# Patient Record
Sex: Female | Born: 1990 | Hispanic: No | Marital: Single | Smoking: Current every day smoker
Health system: Southern US, Community
[De-identification: ages and names within clinical notes are randomized; demographics above are authoritative.]

## PROBLEM LIST (undated history)

## (undated) ENCOUNTER — Ambulatory Visit: Payer: Medicaid Other | Source: Home / Self Care

## (undated) ENCOUNTER — Inpatient Hospital Stay (HOSPITAL_COMMUNITY): Payer: Self-pay

## (undated) DIAGNOSIS — R51 Headache: Secondary | ICD-10-CM

## (undated) DIAGNOSIS — Z789 Other specified health status: Secondary | ICD-10-CM

## (undated) HISTORY — PX: NO PAST SURGERIES: SHX2092

## (undated) HISTORY — PX: MASS EXCISION: SHX2000

## (undated) HISTORY — PX: OTHER SURGICAL HISTORY: SHX169

---
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
## 2011-01-03 LAB — URINALYSIS, ROUTINE W REFLEX MICROSCOPIC
Glucose, UA: 500 — AB
Ketones, ur: NEGATIVE
Protein, ur: NEGATIVE

## 2011-01-03 LAB — URINE MICROSCOPIC-ADD ON

## 2011-01-03 LAB — GLUCOSE, CAPILLARY: Glucose-Capillary: 141 — ABNORMAL HIGH

## 2011-01-03 LAB — POCT PREGNANCY, URINE: Preg Test, Ur: NEGATIVE

## 2011-01-13 LAB — URINALYSIS, ROUTINE W REFLEX MICROSCOPIC
Glucose, UA: NEGATIVE
Ketones, ur: 15 — AB
Protein, ur: NEGATIVE
Urobilinogen, UA: 1

## 2011-01-13 LAB — BASIC METABOLIC PANEL
BUN: 12
Calcium: 9.5
Chloride: 105
Creatinine, Ser: 0.74

## 2011-01-13 LAB — URINE MICROSCOPIC-ADD ON

## 2011-04-04 NOTE — L&D Delivery Note (Cosign Needed)
Delivery Note At 9:45 PM a viable female was delivered via Vaginal, Spontaneous Delivery (Presentation: ROA ).  APGAR: , ; weight .   Placenta status: delivered via schultz, intact  Cord:  with the following complications: loose nuchal and body cord- delivered through  Anesthesia: Epidural  Episiotomy: n/a Lacerations: 1st degree hemostatic Rt periurethral and perineal- not repaired Suture Repair: n/a Est. Blood Loss (mL):   Mom to postpartum.  Baby to nursery-stable.  Plans to breastfeed, desires Mircronor.  Wants circumcision, undecided about in hospital vs. out at this time  Philipp Deputy, CNM in attendance of birth  Joellyn Haff, [place] 05/02/2011, 10:02 PM

## 2011-04-24 ENCOUNTER — Observation Stay (HOSPITAL_COMMUNITY)
Admission: AD | Admit: 2011-04-24 | Discharge: 2011-04-25 | DRG: 780 | Disposition: A | Discharge status: Home / Self Care | Payer: Medicaid Other | Source: Ambulatory Visit | Attending: Obstetrics & Gynecology | Admitting: Obstetrics & Gynecology

## 2011-04-24 ENCOUNTER — Encounter (HOSPITAL_COMMUNITY): Payer: Self-pay

## 2011-04-24 DIAGNOSIS — O4290 Premature rupture of membranes, unspecified as to length of time between rupture and onset of labor, unspecified weeks of gestation: Secondary | ICD-10-CM | POA: Diagnosis present

## 2011-04-24 DIAGNOSIS — O479 False labor, unspecified: Principal | ICD-10-CM | POA: Diagnosis present

## 2011-04-24 HISTORY — DX: Headache: R51

## 2011-04-24 LAB — CBC
HCT: 39.7 % (ref 36.0–46.0)
Hemoglobin: 13.1 g/dL (ref 12.0–15.0)
MCHC: 33 g/dL (ref 30.0–36.0)
MCV: 91.9 fL (ref 78.0–100.0)

## 2011-04-24 LAB — POCT FERN TEST: Fern Test: POSITIVE

## 2011-04-24 LAB — ABO/RH: RH Type: POSITIVE

## 2011-04-24 LAB — ANTIBODY SCREEN: Antibody Screen: NEGATIVE

## 2011-04-24 LAB — HIV ANTIBODY (ROUTINE TESTING W REFLEX): HIV: NONREACTIVE

## 2011-04-24 MED ORDER — ONDANSETRON HCL 4 MG/2ML IJ SOLN: 4.0000 mg | Freq: Four times a day (QID) | INTRAMUSCULAR | Status: DC | PRN

## 2011-04-24 MED ORDER — LIDOCAINE HCL (PF) 1 % IJ SOLN: 30.0000 mL | INTRAMUSCULAR | Status: DC | PRN

## 2011-04-24 MED ORDER — OXYTOCIN BOLUS FROM INFUSION
500.0000 mL | Freq: Once | INTRAVENOUS | Status: DC
  Filled 2011-04-24: qty 500

## 2011-04-24 MED ORDER — CITRIC ACID-SODIUM CITRATE 334-500 MG/5ML PO SOLN: 30.0000 mL | ORAL | Status: DC | PRN

## 2011-04-24 MED ORDER — LACTATED RINGERS IV SOLN
INTRAVENOUS | Status: DC
  Administered 2011-04-24: 20:00:00 via INTRAVENOUS

## 2011-04-24 MED ORDER — ACETAMINOPHEN 325 MG PO TABS
650.0000 mg | ORAL_TABLET | ORAL | Status: DC | PRN
  Administered 2011-04-24: 650 mg via ORAL
  Filled 2011-04-24: qty 2

## 2011-04-24 MED ORDER — LACTATED RINGERS IV SOLN
500.0000 mL | INTRAVENOUS | Status: DC | PRN
Start: 2011-04-24 — End: 2011-04-25

## 2011-04-24 MED ORDER — FLEET ENEMA 7-19 GM/118ML RE ENEM: 1.0000 | ENEMA | RECTAL | Status: DC | PRN

## 2011-04-24 MED ORDER — OXYCODONE-ACETAMINOPHEN 5-325 MG PO TABS: 2.0000 | ORAL_TABLET | ORAL | Status: DC | PRN

## 2011-04-24 MED ORDER — OXYTOCIN 20 UNITS IN LACTATED RINGERS INFUSION - SIMPLE: 125.0000 mL/h | Freq: Once | INTRAVENOUS | Status: DC

## 2011-04-24 MED ORDER — IBUPROFEN 600 MG PO TABS: 600.0000 mg | ORAL_TABLET | Freq: Four times a day (QID) | ORAL | Status: DC | PRN

## 2011-04-24 NOTE — H&P (Signed)
Alexa [name] is a 21 y.o. female presenting for SROM  Patient states fluid leakage around 1400 today. Contractions mild and irregular. No complaints at this time  [facility] Maternal labs normal. GBS negative. Pregnancy uncomplicated.. . Maternal Medical History:  Reason for admission: Reason for admission: rupture of membranes.  Reason for Admission:   nauseaContractions: Onset was 3-5 hours ago.   Frequency: irregular.   Perceived severity is mild.    Fetal activity: Perceived fetal activity is normal.   Last perceived fetal movement was within the past hour.    Prenatal Complications - Diabetes: none.    OB History    Grav Para Term Preterm Abortions TAB SAB Ect Mult Living   1              Past Medical History  Diagnosis Date  . Headache    Past Surgical History  Procedure Date  . No past surgeries    Family History: family history is not on file. Social History:  reports that she has never smoked. She does not have any smokeless tobacco history on file. She reports that she does not drink alcohol or use illicit drugs.  Review of Systems  Constitutional: Negative for fever and chills.  Eyes: Negative for blurred vision and double vision.  Respiratory: Negative for sputum production.   Cardiovascular: Negative for chest pain and palpitations.  Gastrointestinal: Negative for nausea, vomiting, abdominal pain, diarrhea and constipation.  Genitourinary: Negative for dysuria, urgency and frequency.  Neurological: Negative for headaches.  All other systems reviewed and are negative.    Dilation: 3 Effacement (%): 60 Station: -3 Exam by:: Dr. Garen Lah Blood pressure 132/77, pulse 87, temperature 98.4 F (36.9 C), temperature source Oral, resp. rate 16, height 5\' 2"  (1.575 m), weight 158 lb 6.4 oz (71.85 kg), SpO2 99.00%. Maternal Exam:  Uterine Assessment: Contraction strength is mild.  Abdomen: Patient reports  no abdominal tenderness. Introitus: Normal vulva. Normal vagina.  Ferning test: positive.  Amniotic fluid character: clear.  Pelvis: adequate for delivery.   Cervix: Cervix evaluated by sterile speculum exam and digital exam.     Physical Exam  Constitutional: She appears well-developed and well-nourished. No distress.  Neurological: She is alert. She has normal reflexes.  Skin: Skin is warm and dry. No rash noted. She is not diaphoretic. No erythema. No pallor.  Psychiatric: She has a normal mood and affect. Her behavior is normal. Judgment and thought content normal.    Prenatal labs: ABO, Rh:   Antibody:   Rubella:   RPR: NON REACTIVE (06/09 1520)  HBsAg:    HIV:    GBS:     Assessment/Plan: SROM  - admit to L&D  - SROM at 1400 clear fluid  - pregnancy uncomplicated  - GBS neg  - epidural per patient  - routine care, expectant management  - anticipate SVD   Cameron Proud 04/24/2011, 7:20 PM

## 2011-04-24 NOTE — Progress Notes (Signed)
Pt may go to room 164. 

## 2011-04-24 NOTE — Progress Notes (Signed)
Provider made aware of pt status: FHT tracing, uterine contraction pattern, SVE. Pt denies any more leaking of fluid and denies pain. Will be at bedside to evaluate.

## 2011-04-24 NOTE — Progress Notes (Signed)
Khai Torbert is a 21 y.o. G1P0 at [redacted]w[redacted]d   Subjective: Has had migraine. Tylenol has helped.  Objective: BP 126/80  Pulse 82  Temp(Src) 98.5 F (36.9 C) (Oral)  Resp 20  Ht 5\' 2"  (1.575 m)  Wt 71.668 kg (158 lb)  BMI 28.90 kg/m2  SpO2 99%      FHT:  FHR: 1146 bpm, variability: moderate,  accelerations:  Present,  decelerations:  Absent UC:   regular, every 6 minutes SVE:   Dilation: 1 Effacement (%): 50 Station: -3 Exam by:: Piloto de la Paz MD  Labs: Lab Results  Component Value Date   WBC 11.1* 04/24/2011   HGB 13.1 04/24/2011   HCT 39.7 04/24/2011   MCV 91.9 04/24/2011   PLT 157 04/24/2011    Assessment / Plan: PROM   Labor: Progressing normally Fetal Wellbeing:  Category I Pain Control:  Labor support without medications I/D:  n/a Anticipated MOD:  NSVD We checked with amnisure since pt has not have any more vaginal fluid loss and no cervical changes that favor active labor. Induction of labor will be considered if test is positive.  D. Piloto Sherron Flemings Paz. MD PGY-1  04/24/2011, 11:37 PM

## 2011-04-24 NOTE — Progress Notes (Signed)
Patient states that between 1300 and 1400 she sat up and had leaking clear fluid with no odor. Has not had any since. Has been having a persistant headache for about one week and does have some mild cramping. Reports no bleeding and has good fetal movement.

## 2011-04-25 NOTE — Discharge Summary (Signed)
Obstetric Discharge Summary Reason for Admission: rupture of membranes Intrapartum Procedures: n/a Postpartum Procedures: none Complications-Operative and Postpartum: none Hemoglobin  Date Value Range Status  04/24/2011 13.1  12.0-15.0 (g/dL) Final     HCT  Date Value Range Status  04/24/2011 39.7  36.0-46.0 (%) Final    Discharge Diagnoses: false labor. Pt admitted for possible SROM with ferning. Amnisure negative. No cervical changes and FHT reassuring. Discharge Information: Date: 04/25/2011 Activity: unrestricted Diet: routine Medications: tylenol Condition: improved Discharge to: home Follow-up Information    Call [facility] [place]. (As needed, if symptoms worsen, at scheduled appointment)    Contact information:   [address] [facility] [place] [address]          Newborn Data: This patient has no babies on file.  D. Piloto Sherron Flemings Paz. MD PGY-1  04/25/2011, 12:17 AM

## 2011-04-26 NOTE — H&P (Signed)
Agree with note. 

## 2011-05-02 ENCOUNTER — Inpatient Hospital Stay (HOSPITAL_COMMUNITY)
Admission: AD | Admit: 2011-05-02 | Discharge: 2011-05-04 | DRG: 775 | Disposition: A | Discharge status: Home / Self Care | Payer: Medicaid Other | Source: Ambulatory Visit | Attending: Obstetrics and Gynecology | Admitting: Obstetrics and Gynecology

## 2011-05-02 ENCOUNTER — Inpatient Hospital Stay (HOSPITAL_COMMUNITY): Payer: Medicaid Other | Admitting: Anesthesiology

## 2011-05-02 ENCOUNTER — Encounter (HOSPITAL_COMMUNITY): Payer: Self-pay | Admitting: Anesthesiology

## 2011-05-02 ENCOUNTER — Encounter (HOSPITAL_COMMUNITY): Payer: Self-pay

## 2011-05-02 LAB — CBC
MCH: 29.8 pg (ref 26.0–34.0)
Platelets: 142 10*3/uL — ABNORMAL LOW (ref 150–400)
RBC: 4.6 MIL/uL (ref 3.87–5.11)
WBC: 13.3 10*3/uL — ABNORMAL HIGH (ref 4.0–10.5)

## 2011-05-02 LAB — RPR: RPR Ser Ql: NONREACTIVE

## 2011-05-02 MED ORDER — OXYTOCIN BOLUS FROM INFUSION
500.0000 mL | Freq: Once | INTRAVENOUS | Status: DC
  Filled 2011-05-02: qty 500

## 2011-05-02 MED ORDER — ONDANSETRON HCL 4 MG/2ML IJ SOLN: 4.0000 mg | Freq: Four times a day (QID) | INTRAMUSCULAR | Status: DC | PRN

## 2011-05-02 MED ORDER — LACTATED RINGERS IV SOLN: 500.0000 mL | INTRAVENOUS | Status: DC | PRN

## 2011-05-02 MED ORDER — LANOLIN HYDROUS EX OINT: TOPICAL_OINTMENT | CUTANEOUS | Status: DC | PRN

## 2011-05-02 MED ORDER — PRENATAL MULTIVITAMIN CH
1.0000 | ORAL_TABLET | Freq: Every day | ORAL | Status: DC
  Administered 2011-05-03: 1 via ORAL
  Filled 2011-05-02: qty 1

## 2011-05-02 MED ORDER — OXYCODONE-ACETAMINOPHEN 5-325 MG PO TABS
1.0000 | ORAL_TABLET | ORAL | Status: DC | PRN
  Administered 2011-05-03 – 2011-05-04 (×2): 1 via ORAL
  Filled 2011-05-02 (×2): qty 1

## 2011-05-02 MED ORDER — ONDANSETRON HCL 4 MG PO TABS: 4.0000 mg | ORAL_TABLET | ORAL | Status: DC | PRN

## 2011-05-02 MED ORDER — OXYTOCIN 20 UNITS IN LACTATED RINGERS INFUSION - SIMPLE
1.0000 m[IU]/min | INTRAVENOUS | Status: DC
  Administered 2011-05-02: 1 m[IU]/min via INTRAVENOUS
  Filled 2011-05-02: qty 1000

## 2011-05-02 MED ORDER — DIPHENHYDRAMINE HCL 25 MG PO CAPS: 25.0000 mg | ORAL_CAPSULE | Freq: Four times a day (QID) | ORAL | Status: DC | PRN

## 2011-05-02 MED ORDER — ACETAMINOPHEN 325 MG PO TABS: 650.0000 mg | ORAL_TABLET | ORAL | Status: DC | PRN

## 2011-05-02 MED ORDER — PHENYLEPHRINE 40 MCG/ML (10ML) SYRINGE FOR IV PUSH (FOR BLOOD PRESSURE SUPPORT): 80.0000 ug | PREFILLED_SYRINGE | INTRAVENOUS | Status: DC | PRN

## 2011-05-02 MED ORDER — DIBUCAINE 1 % RE OINT
1.0000 "application " | TOPICAL_OINTMENT | RECTAL | Status: DC | PRN
  Filled 2011-05-02: qty 28

## 2011-05-02 MED ORDER — EPHEDRINE 5 MG/ML INJ
10.0000 mg | INTRAVENOUS | Status: DC | PRN
  Filled 2011-05-02: qty 4

## 2011-05-02 MED ORDER — EPHEDRINE 5 MG/ML INJ: 10.0000 mg | INTRAVENOUS | Status: DC | PRN

## 2011-05-02 MED ORDER — IBUPROFEN 600 MG PO TABS: 600.0000 mg | ORAL_TABLET | Freq: Four times a day (QID) | ORAL | Status: DC | PRN

## 2011-05-02 MED ORDER — SIMETHICONE 80 MG PO CHEW: 80.0000 mg | CHEWABLE_TABLET | ORAL | Status: DC | PRN

## 2011-05-02 MED ORDER — FENTANYL 2.5 MCG/ML BUPIVACAINE 1/10 % EPIDURAL INFUSION (WH - ANES)
14.0000 mL/h | INTRAMUSCULAR | Status: DC
  Administered 2011-05-02: 14 mL/h via EPIDURAL
  Administered 2011-05-02: 12 mL/h via EPIDURAL
  Administered 2011-05-02: 14 mL/h via EPIDURAL
  Filled 2011-05-02 (×3): qty 60

## 2011-05-02 MED ORDER — ONDANSETRON HCL 4 MG/2ML IJ SOLN: 4.0000 mg | INTRAMUSCULAR | Status: DC | PRN

## 2011-05-02 MED ORDER — TETANUS-DIPHTH-ACELL PERTUSSIS 5-2.5-18.5 LF-MCG/0.5 IM SUSP
0.5000 mL | Freq: Once | INTRAMUSCULAR | Status: AC
Start: 2011-05-03 — End: 2011-05-03
  Administered 2011-05-03: 0.5 mL via INTRAMUSCULAR
  Filled 2011-05-02: qty 0.5

## 2011-05-02 MED ORDER — SENNOSIDES-DOCUSATE SODIUM 8.6-50 MG PO TABS
2.0000 | ORAL_TABLET | Freq: Every day | ORAL | Status: DC
  Administered 2011-05-03: 2 via ORAL

## 2011-05-02 MED ORDER — ZOLPIDEM TARTRATE 5 MG PO TABS: 5.0000 mg | ORAL_TABLET | Freq: Every evening | ORAL | Status: DC | PRN

## 2011-05-02 MED ORDER — DIPHENHYDRAMINE HCL 50 MG/ML IJ SOLN: 12.5000 mg | INTRAMUSCULAR | Status: DC | PRN

## 2011-05-02 MED ORDER — CITRIC ACID-SODIUM CITRATE 334-500 MG/5ML PO SOLN: 30.0000 mL | ORAL | Status: DC | PRN

## 2011-05-02 MED ORDER — IBUPROFEN 600 MG PO TABS
600.0000 mg | ORAL_TABLET | Freq: Four times a day (QID) | ORAL | Status: DC
  Administered 2011-05-03 – 2011-05-04 (×7): 600 mg via ORAL
  Filled 2011-05-02 (×7): qty 1

## 2011-05-02 MED ORDER — OXYTOCIN 20 UNITS IN LACTATED RINGERS INFUSION - SIMPLE
125.0000 mL/h | Freq: Once | INTRAVENOUS | Status: AC
  Administered 2011-05-02: 500 mL/h via INTRAVENOUS

## 2011-05-02 MED ORDER — LACTATED RINGERS IV SOLN
500.0000 mL | Freq: Once | INTRAVENOUS | Status: AC
  Administered 2011-05-02: 500 mL via INTRAVENOUS

## 2011-05-02 MED ORDER — PHENYLEPHRINE 40 MCG/ML (10ML) SYRINGE FOR IV PUSH (FOR BLOOD PRESSURE SUPPORT)
80.0000 ug | PREFILLED_SYRINGE | INTRAVENOUS | Status: DC | PRN
  Filled 2011-05-02: qty 5

## 2011-05-02 MED ORDER — WITCH HAZEL-GLYCERIN EX PADS: 1.0000 "application " | MEDICATED_PAD | CUTANEOUS | Status: DC | PRN

## 2011-05-02 MED ORDER — FLEET ENEMA 7-19 GM/118ML RE ENEM: 1.0000 | ENEMA | RECTAL | Status: DC | PRN

## 2011-05-02 MED ORDER — LIDOCAINE HCL (PF) 1 % IJ SOLN
30.0000 mL | INTRAMUSCULAR | Status: DC | PRN
  Filled 2011-05-02: qty 30

## 2011-05-02 MED ORDER — TERBUTALINE SULFATE 1 MG/ML IJ SOLN: 0.2500 mg | Freq: Once | INTRAMUSCULAR | Status: DC | PRN

## 2011-05-02 MED ORDER — LACTATED RINGERS IV SOLN
INTRAVENOUS | Status: DC
  Administered 2011-05-02: 125 mL/h via INTRAVENOUS
  Administered 2011-05-02: 20:00:00 via INTRAVENOUS

## 2011-05-02 MED ORDER — OXYCODONE-ACETAMINOPHEN 5-325 MG PO TABS: 1.0000 | ORAL_TABLET | ORAL | Status: DC | PRN

## 2011-05-02 MED ORDER — LIDOCAINE HCL 1.5 % IJ SOLN
INTRAMUSCULAR | Status: DC | PRN
  Administered 2011-05-02 (×2): 5 mL via EPIDURAL

## 2011-05-02 MED ORDER — BENZOCAINE-MENTHOL 20-0.5 % EX AERO
1.0000 "application " | INHALATION_SPRAY | CUTANEOUS | Status: DC | PRN
  Filled 2011-05-02: qty 56

## 2011-05-02 NOTE — Progress Notes (Signed)
Alexa [name] is a 21 y.o. G1P0000 at [redacted]w[redacted]d   Subjective: Feeling pressure but comfortable with epidural Objective: BP 122/83  Pulse 92  Temp(Src) 98.4 F (36.9 C) (Oral)  Resp 20  Ht 5\' 2"  (1.575 m)  Wt 71.668 kg (158 lb)  BMI 28.90 kg/m2  SpO2 100%  FHT:  FHR: 146 bpm, variability: moderate,  accelerations:  Present,  decelerations:  Absent UC:   regular, every 2-3 minutes SVE:   Dilation: 10 Effacement (%): 100 Station: +2 Exam by:: Piloto, MD  Labs: Lab Results  Component Value Date   WBC 13.3* 05/02/2011   HGB 13.7 05/02/2011   HCT 41.9 05/02/2011   MCV 91.1 05/02/2011   PLT 142* 05/02/2011    Assessment / Plan: Augmentation of labor, progressing well  Labor: Progressing on Pitocin, will continue to increase then AROM Fetal Wellbeing:  Category I Pain Control:  Epidural I/D:  n/a Anticipated MOD:  NSVD   D. Piloto [employer]. MD PGY-1 05/02/2011, 8:02 PM

## 2011-05-02 NOTE — Progress Notes (Signed)
Pt states labor began at 0730, denies bleeding or lof, +fm. Was 1cm 1 wk ago. Ctx's less than apart.

## 2011-05-02 NOTE — Progress Notes (Signed)
Dr. Garen Lah notified of SVE, SROM clear fluid, FHR, UC pattern, and pitocin amount.  Will continue to monitor.

## 2011-05-02 NOTE — Progress Notes (Signed)
Alexa [name] is a 21 y.o. G1P0000 at [redacted]w[redacted]d   Subjective: Feeling comfortable with epidural. SROM at 4:00, clear.  Objective: BP 121/75  Pulse 77  Temp(Src) 98.1 F (36.7 C) (Oral)  Resp 18  Ht 5\' 2"  (1.575 m)  Wt 71.668 kg (158 lb)  BMI 28.90 kg/m2  SpO2 100%    FHT:  FHR: 146 bpm, variability: moderate,  accelerations:  Present,  decelerations:  Absent UC:   regular, every 2-3 minutes SVE:   Dilation: 6 Effacement (%): 90 Station: -1 Exam by:: Valentina Lucks, RN  Labs: Lab Results  Component Value Date   WBC 13.3* 05/02/2011   HGB 13.7 05/02/2011   HCT 41.9 05/02/2011   MCV 91.1 05/02/2011   PLT 142* 05/02/2011    Assessment / Plan: Augmentation of labor, progressing well  Labor: Progressing on Pitocin, will continue to increase then AROM Fetal Wellbeing:  Category I Pain Control:  Epidural I/D:  n/a Anticipated MOD:  NSVD   D. Piloto [employer]. MD PGY-1 05/02/2011, 5:36 PM

## 2011-05-02 NOTE — H&P (Signed)
Alexa [name] is a 21 y.o. female presenting for contractions  37yr G1P0 at 39.4wks presents with contractions  Patient began feeling contractions approx 3hrs ago, frequent, severe. No leakage of fluid. Reports good fetal movement. Denies Headache, vision changes, GI or GU complaints  Pt received prenatal care at health dept. Pregnancy uncomplicated. GBS negative. . Maternal Medical History:  Reason for admission: Reason for admission: contractions.  Reason for Admission:   nauseaContractions: Onset was 3-5 hours ago.   Frequency: regular.   Perceived severity is strong.    Fetal activity: Perceived fetal activity is normal.   Last perceived fetal movement was within the past hour.      OB History    Grav Para Term Preterm Abortions TAB SAB Ect Mult Living   1              Past Medical History  Diagnosis Date  . Headache    Past Surgical History  Procedure Date  . No past surgeries    Family History: family history is negative for Anesthesia problems. Social History:  reports that she has never smoked. She has never used smokeless tobacco. She reports that she does not drink alcohol or use illicit drugs.  Review of Systems  Constitutional: Negative for fever.  Eyes: Negative for blurred vision and double vision.  Respiratory: Negative for shortness of breath.   Cardiovascular: Negative for chest pain.  Gastrointestinal: Negative for heartburn, nausea, vomiting and abdominal pain.  Genitourinary: Negative for dysuria and urgency.  Skin: Negative for rash.  Neurological: Negative for headaches.  All other systems reviewed and are negative.    Dilation: 3 Effacement (%): 70 Station: -2;-3 Exam by:: Lucy Chris RNC Blood pressure 132/76, pulse 89. Maternal Exam:  Uterine Assessment: Contraction strength is moderate.  Contraction frequency is regular.      Fetal Exam Fetal Monitor Review: Baseline rate: 150.  Variability: moderate (6-25 bpm).     Pattern: accelerations present and no decelerations.    Fetal State Assessment: Category I - tracings are normal.     Physical Exam  Vitals reviewed. Constitutional: She is oriented to person, place, and time. She appears well-developed and well-nourished. No distress.  HENT:  Head: Normocephalic and atraumatic.  Eyes: EOM are normal. Pupils are equal, round, and reactive to light.  Cardiovascular: Normal rate, regular rhythm, normal heart sounds and intact distal pulses.  Exam reveals no gallop and no friction rub.   No murmur heard. Respiratory: Effort normal and breath sounds normal. No respiratory distress. She has no wheezes. She has no rales.  GI: Soft. She exhibits no distension and no mass. There is no tenderness. There is no rebound and no guarding.  Musculoskeletal: Normal range of motion. She exhibits no edema and no tenderness.  Neurological: She is alert and oriented to person, place, and time. She has normal reflexes.  Skin: Skin is warm and dry. No rash noted. She is not diaphoretic. No erythema. No pallor.  Psychiatric: She has a normal mood and affect. Her behavior is normal. Judgment and thought content normal.    Prenatal labs: ABO, Rh: AB/Positive/-- (01/21 2029) Antibody: Negative (01/21 2029) Rubella: Immune (01/21 2029) RPR: NON REACTIVE (01/21 1930)  HBsAg: Negative (01/21 2029)  HIV: Non-reactive (01/21 2029)  GBS: Negative (01/21 2029)   Assessment/Plan: Active Labor  - admit to L&D  - unruptured  - patient requests epidural  - category I tracing  - GBS negative  - expectant management  - anticipate SVD  Cameron Proud 05/02/2011, 9:56 AM

## 2011-05-02 NOTE — H&P (Signed)
Agree with above note.  Nyrie Sigal 05/02/2011 4:56 PM   

## 2011-05-02 NOTE — Progress Notes (Signed)
Alexa [name] is a 21 y.o. G1P0000 at [redacted]w[redacted]d by ultrasound admitted for active labor  Subjective: Patient doing well, comfotbale with epidural, not feeling contractions as much  Objective: BP 121/83  Pulse 81  Temp(Src) 97.6 F (36.4 C) (Oral)  Resp 18  Ht 5\' 2"  (1.575 m)  Wt 71.668 kg (158 lb)  BMI 28.90 kg/m2  SpO2 100%      FHT:  FHR: 150 bpm, variability: moderate,  accelerations:  Abscent,  decelerations:  Absent UC:   regular, every 3-5 minutes SVE:   Dilation: 4 Effacement (%): 70 Station: -1 Exam by:: Valentina Lucks, RN  Labs: Lab Results  Component Value Date   WBC 13.3* 05/02/2011   HGB 13.7 05/02/2011   HCT 41.9 05/02/2011   MCV 91.1 05/02/2011   PLT 142* 05/02/2011    Assessment / Plan: Spontaneous labor, progressing normally  Labor: progressing as expected, will start low dose pitocin and continue to monitor.  Preeclampsia:  no S/Sx Fetal Wellbeing:  Category I Pain Control:  Epidural I/D:  n/a Anticipated MOD:  NSVD  Cameron Proud 05/02/2011, 2:17 PM

## 2011-05-02 NOTE — Progress Notes (Signed)
Dr. Garen Lah at bedside and notified of pt status, SVE, FHR, UC pattern, and epidural placement.  Will continue to monitor.

## 2011-05-02 NOTE — Anesthesia Preprocedure Evaluation (Signed)

## 2011-05-02 NOTE — Progress Notes (Signed)
Pt states u/c's less than 5 minutes, denies bleeding or lof, +FM. Was 1cm in officde 1 week ago.

## 2011-05-02 NOTE — Anesthesia Procedure Notes (Signed)
Epidural Patient location during procedure: OB Start time: 05/02/2011 11:12 AM  Staffing Anesthesiologist: Brayton Caves R Performed by: anesthesiologist   Preanesthetic Checklist Completed: patient identified, site marked, surgical consent, pre-op evaluation, timeout performed, IV checked, risks and benefits discussed and monitors and equipment checked  Epidural Patient position: sitting Prep: site prepped and draped and DuraPrep Patient monitoring: continuous pulse ox and blood pressure Approach: midline Injection technique: LOR air and LOR saline  Needle:  Needle type: Tuohy  Needle gauge: 17 G Needle length: 9 cm Needle insertion depth: 5 cm cm Catheter type: closed end flexible Catheter size: 19 Gauge Catheter at skin depth: 10 cm Test dose: negative  Assessment Events: blood not aspirated, injection not painful, no injection resistance, negative IV test and no paresthesia  Additional Notes Patient identified.  Risk benefits discussed including failed block, incomplete pain control, headache, nerve damage, paralysis, blood pressure changes, nausea, vomiting, reactions to medication both toxic or allergic, and postpartum back pain.  Patient expressed understanding and wished to proceed.  All questions were answered.  Sterile technique used throughout procedure and epidural site dressed with sterile barrier dressing. No paresthesia or other complications noted.The patient did not experience any signs of intravascular injection such as tinnitus or metallic taste in mouth nor signs of intrathecal spread such as rapid motor block. Please see nursing notes for vital signs.

## 2011-05-02 NOTE — Progress Notes (Signed)
Kasi Lasky is a 21 y.o. G1P0000 at [redacted]w[redacted]d by ultrasound admitted for active labor  Subjective: Patient doing well, comfortable with epidural.   Objective: BP 117/79  Pulse 82  Temp(Src) 97.6 F (36.4 C) (Oral)  Resp 20  Ht 5\' 2"  (1.575 m)  Wt 71.668 kg (158 lb)  BMI 28.90 kg/m2  SpO2 100%      FHT:  FHR: 150 bpm, variability: moderate,  accelerations:  Present,  decelerations:  Absent UC:   regular, every 3-5 minutes SVE:   Dilation: 3 Effacement (%): 70 Station: -2;-3 Exam by:: Lucy Chris RNC  Labs: Lab Results  Component Value Date   WBC 13.3* 05/02/2011   HGB 13.7 05/02/2011   HCT 41.9 05/02/2011   MCV 91.1 05/02/2011   PLT 142* 05/02/2011    Assessment / Plan: Spontaneous labor, progressing normally  Labor: Progressing normally Preeclampsia:  no SSx Fetal Wellbeing:  Category I Pain Control:  Epidural I/D:  n/a Anticipated MOD:  NSVD  Will continue to monitor, consider adding Pitocin if not progressing appropriately, consider AROM once further dilated.    Cameron Proud 05/02/2011, 11:47 AM

## 2011-05-02 NOTE — Progress Notes (Signed)
  Subjective:  Patient comfortable, no complaints Objective: BP 121/75  Pulse 77  Temp(Src) 98.1 F (36.7 C) (Oral)  Resp 18  Ht 5\' 2"  (1.575 m)  Wt 71.668 kg (158 lb)  BMI 28.90 kg/m2  SpO2 100%   Total I/O In: -  Out: 100 [Urine:100]  FHT:  FHR: 150 bpm, variability: moderate,  accelerations:  Present,  decelerations:  Absent UC:   regular, every 2-3 minutes SVE:   Dilation: 6 Effacement (%): 90 Station: -1 Exam by:: Valentina Lucks, RN  Labs: Lab Results  Component Value Date   WBC 13.3* 05/02/2011   HGB 13.7 05/02/2011   HCT 41.9 05/02/2011   MCV 91.1 05/02/2011   PLT 142* 05/02/2011    Assessment / Plan: Spontaneous labor, progressing normally  Labor: Progressing normally ; patient SROM in the interim, good cervical change, will continue to monitor, Preeclampsia:  no SSx Fetal Wellbeing:  Category I Pain Control:  Epidural I/D:  n/a Anticipated MOD:  NSVD  Cameron Proud 05/02/2011, 5:42 PM

## 2011-05-03 MED ORDER — GLYCERIN (LAXATIVE) 2.1 G RE SUPP
1.0000 | Freq: Once | RECTAL | Status: AC
  Administered 2011-05-03: 1 via RECTAL
  Filled 2011-05-03: qty 1

## 2011-05-03 NOTE — Anesthesia Postprocedure Evaluation (Signed)
  Anesthesia Post-op Note  Patient: Alexa [name]  Procedure(s) Performed: * No procedures listed *  Patient Location: Mother/Baby  Anesthesia Type: Epidural  Level of Consciousness: alert  and oriented  Airway and Oxygen Therapy: Patient Spontanous Breathing  Post-op Pain: mild  Post-op Assessment: Patient's Cardiovascular Status Stable and Respiratory Function Stable  Post-op Vital Signs: stable  Complications: No apparent anesthesia complications

## 2011-05-03 NOTE — Progress Notes (Signed)
Post Partum Day 1 Subjective: no complaints, up ad lib, voiding and tolerating PO  Objective: Blood pressure 111/72, pulse 93, temperature 97.9 F (36.6 C), temperature source Oral, resp. rate 20, height 5\' 2"  (1.575 m), weight 71.668 kg (158 lb), SpO2 98.00%, unknown if currently breastfeeding.  Physical Exam:  General: alert, cooperative and no distress Lochia: appropriate Uterine Fundus: firm Incision: n/a DVT Evaluation: No evidence of DVT seen on physical exam. Negative Homan's sign. No cords or calf tenderness. No significant calf/ankle edema.   Basename 05/02/11 1030  HGB 13.7  HCT 41.9    Assessment/Plan: Plan for discharge tomorrow, Breastfeeding, Lactation consult and Contraception [place]; wants out of hospital circumcision   LOS: 1 day   Joellyn Haff, SNM 05/03/2011, 7:33 AM

## 2011-05-03 NOTE — Progress Notes (Signed)
UR chart review completed.  

## 2011-05-04 NOTE — Progress Notes (Signed)
Post Partum Day 2 Subjective: no complaints, up ad lib, voiding, tolerating PO, + flatus and (+)BM3 Patient doing well, states she would like to go home  Objective: Blood pressure 114/77, pulse 96, temperature 98.4 F (36.9 C), temperature source Oral, resp. rate 18, height 5\' 2"  (1.575 m), weight 71.668 kg (158 lb), SpO2 98.00%, unknown if currently breastfeeding.  Physical Exam:  General: alert, cooperative, appears stated age and no distress Lochia: appropriate Uterine Fundus: firm Incision: none DVT Evaluation: No evidence of DVT seen on physical exam. Negative Homan's sign. No cords or calf tenderness. No significant calf/ankle edema. Lungs CTA bilaterally, no wheeze/rhonchi/rales Heart RRR no murmur, gallop, rub   Basename 05/02/11 1030  HGB 13.7  HCT 41.9    Assessment/Plan: Discharge home, Breastfeeding and Contraception OCPs Follow up with [facility] 6wks Ibuprofen for pain   LOS: 2 days   Cameron Proud 05/04/2011, 7:28 AM

## 2011-05-04 NOTE — Discharge Summary (Signed)
Obstetric Discharge Summary Reason for Admission: onset of labor Prenatal Procedures: none Intrapartum Procedures: spontaneous vaginal delivery Postpartum Procedures: none Complications-Operative and Postpartum: 1st deg periurethral laceration Hemoglobin  Date Value Range Status  05/02/2011 13.7  12.0-15.0 (g/dL) Final     HCT  Date Value Range Status  05/02/2011 41.9  36.0-46.0 (%) Final    Discharge Diagnoses: Term Pregnancy-delivered  Discharge Information: Date: 05/04/2011 Activity: unrestricted and pelvic rest Diet: routine Medications: PNV and Ibuprofen Condition: stable Instructions: refer to practice specific booklet Discharge to: home Follow-up Information    Follow up with [facility] in 6 weeks.         Newborn Data: Live born female  Birth Weight: 6 lb 14.2 oz (3125 g) APGAR: 8, 9  Home with mother.  Cameron Proud 05/04/2011, 7:33 AM

## 2011-05-05 NOTE — Discharge Summary (Signed)
Agree with above note.  Alexa [name] 05/05/2011 5:52 AM   

## 2011-05-08 NOTE — Progress Notes (Signed)
I agree with the above. Cam Hai 9:42 PM 05/08/2011

## 2011-06-02 ENCOUNTER — Encounter (HOSPITAL_COMMUNITY): Payer: Self-pay

## 2011-06-02 ENCOUNTER — Emergency Department (HOSPITAL_COMMUNITY)
Admission: EM | Admit: 2011-06-02 | Discharge: 2011-06-02 | Disposition: A | Discharge status: Home / Self Care | Payer: Medicaid Other | Source: Home / Self Care | Attending: Emergency Medicine | Admitting: Emergency Medicine

## 2011-06-02 DIAGNOSIS — N9489 Other specified conditions associated with female genital organs and menstrual cycle: Secondary | ICD-10-CM

## 2011-06-02 DIAGNOSIS — N766 Ulceration of vulva: Secondary | ICD-10-CM

## 2011-06-02 LAB — WET PREP, GENITAL

## 2011-06-02 MED ORDER — METRONIDAZOLE 0.75 % VA GEL: 1.0000 | Freq: Two times a day (BID) | VAGINAL | Status: AC

## 2011-06-02 MED ORDER — TRIAMCINOLONE ACETONIDE 0.1 % EX CREA: TOPICAL_CREAM | Freq: Two times a day (BID) | CUTANEOUS | Status: DC

## 2011-06-02 NOTE — ED Provider Notes (Signed)
Chief Complaint  Patient presents with  . Vaginal Pain    History of Present Illness:   The patient has had a history of vulvar pain, near the clitoris ever since she gave birth about 4 weeks ago. She can feel a tender spot in this area. It sometimes hurts when she urinates. She's had slight vaginal discharge. She denies any itching or odor. No pelvic or lower back pain. No fever, chills, nausea, or vomiting. Her blood in the urine. She denies being sexually active since her delivery.  Review of Systems:  Other than noted above, the patient denies any of the following symptoms: Systemic:  No fever, chills, sweats, fatigue, or weight loss. GI:  No abdominal pain, nausea, anorexia, vomiting, diarrhea, constipation, melena or hematochezia. GU:  No dysuria, frequency, urgency, hematuria, vaginal discharge, itching, or abnormal vaginal bleeding. Skin:  No rash or itching.   PMFSH:  Past medical history, family history, social history, meds, and allergies were reviewed.  Physical Exam:   Vital signs:  BP 100/63  Pulse 84  Temp(Src) 98.3 F (36.8 C) (Oral)  Resp 16  SpO2 100% General:  Alert, oriented and in no distress. Lungs:  Breath sounds clear and equal bilaterally.  No wheezes, rales or rhonchi. Heart:  Regular rhythm.  No gallops or murmers. Abdomen:  Soft, flat and non-distended.  No organomegaly or mass.  No tenderness, guarding or rebound.  Bowel sounds normally active. Pelvic exam:  External exam reveals a small ulcer measuring just a couple of millimeters in size on the anterior right vulva, just posterior to the clitoris. Vaginal and cervical mucosa were unremarkable. There was a small amount of discharge. A wet prep was obtained. No cervical motion tenderness. Uterus was anterior and normal in size and shape. No adnexal masses or tenderness.  Skin:  Clear, warm and dry.  Labs:   Results for orders placed during the hospital encounter of 06/02/11  WET PREP, GENITAL   Component Value Range   Yeast Wet Prep HPF POC NONE SEEN  NONE SEEN    Trich, Wet Prep NONE SEEN  NONE SEEN    Clue Cells Wet Prep HPF POC FEW (*) NONE SEEN    WBC, Wet Prep HPF POC MANY (*) NONE SEEN      Assessment:   Diagnoses that have been ruled out:  None  Diagnoses that are still under consideration:  None  Final diagnoses:  Genital ulcer, female - this doesn't look like herpes or syphilis, although he herpes culture and RPR were obtained. I think is more likely that it is due to trauma from her delivery. I suggested to application of a cortisone cream for a week if no better at the end of that time consulting with gynecologist.     Plan:   1.  The following meds were prescribed:   New Prescriptions   TRIAMCINOLONE CREAM (KENALOG) 0.1 %    Apply topically 2 (two) times daily.   2.  The patient was instructed in symptomatic care and handouts were given. 3.  The patient was told to return if becoming worse in any way, if no better in 3 or 4 days, and given some red flag symptoms that would indicate earlier return.  Follow up:  The patient was told to follow up with Dr. Waynard Reeds if no better in one week.  Roque Lias, MD 06/02/11 [phone]

## 2011-06-02 NOTE — ED Notes (Signed)
C/o 10 day duration of perineal are pain ; patient is 4 weeks postpartum, and has not been in for her 6 week check up , and has not notified her OB about her problems; denies having been sexually active since her delivery, but has reportedly had an "odor" past couple of days

## 2011-06-03 LAB — RPR: RPR Ser Ql: NONREACTIVE

## 2011-06-05 LAB — HERPES SIMPLEX VIRUS CULTURE

## 2011-06-05 LAB — GC/CHLAMYDIA PROBE AMP, GENITAL: GC Probe Amp, Genital: NEGATIVE

## 2011-06-06 NOTE — ED Notes (Signed)
GC/Chlamydia neg., Wet prep: few clue cells, many WBC's, RPR non-reactive, Herpes culture: No Herpes Simplex virus detected. Pt. adequately treated with Metrogel. Vassie Moselle 06/06/2011

## 2011-06-13 ENCOUNTER — Telehealth (HOSPITAL_COMMUNITY): Payer: Self-pay | Admitting: *Deleted

## 2011-06-13 NOTE — ED Notes (Signed)
Pt. called and verified x 2. Pt. given results and told she needs the Metrogel that was e-prescribed to the [facility] on [address]. Pt. said the pharmacy called her and she picked it up and has taken it. Vassie Moselle 06/13/2011

## 2011-11-04 ENCOUNTER — Emergency Department (HOSPITAL_COMMUNITY)
Admission: EM | Admit: 2011-11-04 | Discharge: 2011-11-05 | Disposition: A | Discharge status: Home / Self Care | Payer: Medicaid Other | Attending: Emergency Medicine | Admitting: Emergency Medicine

## 2011-11-04 ENCOUNTER — Emergency Department (HOSPITAL_COMMUNITY): Payer: Self-pay

## 2011-11-04 ENCOUNTER — Encounter (HOSPITAL_COMMUNITY): Payer: Self-pay | Admitting: Emergency Medicine

## 2011-11-04 DIAGNOSIS — S161XXA Strain of muscle, fascia and tendon at neck level, initial encounter: Secondary | ICD-10-CM

## 2011-11-04 DIAGNOSIS — S335XXA Sprain of ligaments of lumbar spine, initial encounter: Secondary | ICD-10-CM

## 2011-11-04 DIAGNOSIS — M545 Low back pain, unspecified: Secondary | ICD-10-CM | POA: Insufficient documentation

## 2011-11-04 DIAGNOSIS — F17200 Nicotine dependence, unspecified, uncomplicated: Secondary | ICD-10-CM | POA: Insufficient documentation

## 2011-11-04 DIAGNOSIS — M542 Cervicalgia: Secondary | ICD-10-CM | POA: Insufficient documentation

## 2011-11-04 DIAGNOSIS — S139XXA Sprain of joints and ligaments of unspecified parts of neck, initial encounter: Secondary | ICD-10-CM | POA: Insufficient documentation

## 2011-11-04 LAB — POCT PREGNANCY, URINE: Preg Test, Ur: NEGATIVE

## 2011-11-04 MED ORDER — IBUPROFEN 200 MG PO TABS
600.0000 mg | ORAL_TABLET | Freq: Once | ORAL | Status: AC
  Administered 2011-11-04: 600 mg via ORAL
  Filled 2011-11-04: qty 3

## 2011-11-04 MED ORDER — OXYCODONE-ACETAMINOPHEN 5-325 MG PO TABS
1.0000 | ORAL_TABLET | Freq: Once | ORAL | Status: AC
  Administered 2011-11-04: 1 via ORAL
  Filled 2011-11-04: qty 1
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
## 2011-11-04 NOTE — ED Notes (Signed)
Per NP, OK to ambulate pt to the bathroom with c-collar on even though x-rays have not been completed.

## 2011-11-05 MED ORDER — IBUPROFEN 600 MG PO TABS: 600.0000 mg | ORAL_TABLET | Freq: Three times a day (TID) | ORAL | Status: AC | PRN

## 2011-11-05 MED ORDER — OXYCODONE-ACETAMINOPHEN 5-325 MG PO TABS: 1.0000 | ORAL_TABLET | Freq: Four times a day (QID) | ORAL | Status: AC | PRN

## 2011-11-05 MED ORDER — ONDANSETRON 4 MG PO TBDP
4.0000 mg | ORAL_TABLET | Freq: Once | ORAL | Status: AC
  Administered 2011-11-05: 4 mg via ORAL
  Filled 2011-11-05: qty 1

## 2011-11-06 ENCOUNTER — Encounter (HOSPITAL_COMMUNITY): Payer: Self-pay

## 2011-11-07 NOTE — ED Provider Notes (Signed)
Medical screening examination/treatment/procedure(s) were performed by non-physician practitioner and as supervising physician I was immediately available for consultation/collaboration.  Janiyah Beery T Leronda Lewers, MD 11/07/11 0835 

## 2012-04-05 ENCOUNTER — Ambulatory Visit: Payer: Managed Care, Other (non HMO) | Admitting: Emergency Medicine

## 2012-04-05 VITALS — BP 98/66 | HR 96 | Temp 98.0°F | Resp 18 | Ht 63.0 in | Wt 117.4 lb

## 2012-04-05 DIAGNOSIS — Z331 Pregnant state, incidental: Secondary | ICD-10-CM

## 2012-04-05 DIAGNOSIS — G43909 Migraine, unspecified, not intractable, without status migrainosus: Secondary | ICD-10-CM

## 2012-04-05 DIAGNOSIS — N912 Amenorrhea, unspecified: Secondary | ICD-10-CM

## 2012-04-05 DIAGNOSIS — Z3490 Encounter for supervision of normal pregnancy, unspecified, unspecified trimester: Secondary | ICD-10-CM

## 2012-04-05 MED ORDER — ONDANSETRON 8 MG PO TBDP: 8.0000 mg | ORAL_TABLET | Freq: Three times a day (TID) | ORAL | Status: DC | PRN

## 2012-04-05 MED ORDER — ONDANSETRON HCL 4 MG/2ML IJ SOLN
4.0000 mg | Freq: Once | INTRAMUSCULAR | Status: AC
  Administered 2012-04-05: 4 mg via INTRAMUSCULAR

## 2012-04-05 MED ORDER — PRENATAL VITAMINS 28-0.8 MG PO TABS: 1.0000 | ORAL_TABLET | Freq: Every day | ORAL | Status: DC

## 2012-04-05 NOTE — Progress Notes (Signed)
[facility] [address], [place] [address] [phone]  Date:  04/05/2012   Name:  Alexa [name]   DOB:  [birth year]   MRN:  [id number]  PCP:  No primary provider on file.    Chief Complaint: Headache and Emesis   History of Present Illness:  Alexa [name] is a 22 y.o. very pleasant female patient who presents with the following:  History of migraine headaches, self diagnosed.   Takes ibuprofen for the headaches.  Now to office with severe headache associated with photophobia and nausea and vomiting.  On OCP. Thinks she missed her last period in December.  Not compliant on OCP.   G1P1A0  Patient Active Problem List  Diagnosis  . CHEST PAIN UNSPECIFIED  . Family history of Down syndrome    Past Medical History  Diagnosis Date  . Headache     Past Surgical History  Procedure Date  . No past surgeries     History  Substance Use Topics  . Smoking status: Current Some Day Smoker  . Smokeless tobacco: Never Used  . Alcohol Use: No    Family History  Problem Relation Age of Onset  . Anesthesia problems Neg Hx     No Known Allergies  Medication list has been reviewed and updated.  Current Outpatient Prescriptions on File Prior to Visit  Medication Sig Dispense Refill  . acetaminophen (TYLENOL) 325 MG tablet Take 650 mg by mouth every 6 (six) hours as needed. Patient used this medication for a headache.      . Evening Primrose Oil 1000 MG CAPS Take 1 capsule by mouth daily.      . Prenatal Vit-Fe Sulfate-FA (PRENATAL VITAMIN PO) Take by mouth.        . triamcinolone cream (KENALOG) 0.1 % Apply topically 2 (two) times daily.  15 g  0    Review of Systems:  As per HPI, otherwise negative.    Physical Examination: Filed Vitals:   04/05/12 1123  BP: 98/66  Pulse: 96  Temp: 98 F (36.7 C)  Resp: 18   Filed Vitals:   04/05/12 1123  Height: 5\' 3"  (1.6 m)  Weight: 117 lb 6.4 oz (53.252 kg)   Body mass index is  20.80 kg/(m^2). Ideal Body Weight: Weight in (lb) to have BMI = 25: 140.8   GEN: WDWN, NAD, Non-toxic, A & O x 3 HEENT: Atraumatic, Normocephalic. Neck supple. No masses, No LAD. Ears and Nose: No external deformity. CV: RRR, No M/G/R. No JVD. No thrill. No extra heart sounds. PULM: CTA B, no wheezes, crackles, rhonchi. No retractions. No resp. distress. No accessory muscle use. ABD: S, NT, ND, +BS. No rebound. No HSM. EXTR: No c/c/e NEURO Normal gait.  PSYCH: Normally interactive. Conversant. Not depressed or anxious appearing.  Calm demeanor.    Assessment and Plan: Migraine Pregnancy vofran Tylenol for headache Avoid NSAID Neurology consultation  Carmelina Dane, MD  Results for orders placed in visit on 04/05/12  POCT URINE PREGNANCY      Component Value Range   Preg Test, Ur Positive

## 2012-04-05 NOTE — Addendum Note (Signed)
Addended by: Cydney Ok on: 04/05/2012 12:32 PM   Modules accepted: Orders

## 2012-04-06 ENCOUNTER — Encounter (HOSPITAL_COMMUNITY): Payer: Self-pay | Admitting: Emergency Medicine

## 2012-04-06 ENCOUNTER — Emergency Department (HOSPITAL_COMMUNITY)
Admission: EM | Admit: 2012-04-06 | Discharge: 2012-04-07 | Disposition: A | Discharge status: Home / Self Care | Payer: Managed Care, Other (non HMO) | Attending: Emergency Medicine | Admitting: Emergency Medicine

## 2012-04-06 DIAGNOSIS — R112 Nausea with vomiting, unspecified: Secondary | ICD-10-CM | POA: Insufficient documentation

## 2012-04-06 DIAGNOSIS — H539 Unspecified visual disturbance: Secondary | ICD-10-CM | POA: Insufficient documentation

## 2012-04-06 DIAGNOSIS — R209 Unspecified disturbances of skin sensation: Secondary | ICD-10-CM | POA: Insufficient documentation

## 2012-04-06 DIAGNOSIS — G43109 Migraine with aura, not intractable, without status migrainosus: Secondary | ICD-10-CM | POA: Insufficient documentation

## 2012-04-06 DIAGNOSIS — F17200 Nicotine dependence, unspecified, uncomplicated: Secondary | ICD-10-CM | POA: Insufficient documentation

## 2012-04-06 MED ORDER — SODIUM CHLORIDE 0.9 % IV BOLUS (SEPSIS)
1000.0000 mL | Freq: Once | INTRAVENOUS | Status: AC
  Administered 2012-04-06: 1000 mL via INTRAVENOUS

## 2012-04-06 MED ORDER — METOCLOPRAMIDE HCL 5 MG/ML IJ SOLN
10.0000 mg | Freq: Once | INTRAMUSCULAR | Status: AC
  Administered 2012-04-06: 10 mg via INTRAVENOUS
  Filled 2012-04-06: qty 2

## 2012-04-06 MED ORDER — DIPHENHYDRAMINE HCL 50 MG/ML IJ SOLN
25.0000 mg | Freq: Once | INTRAMUSCULAR | Status: AC
  Administered 2012-04-06: 25 mg via INTRAVENOUS
  Filled 2012-04-06: qty 1

## 2012-04-06 MED ORDER — DEXAMETHASONE SODIUM PHOSPHATE 10 MG/ML IJ SOLN
10.0000 mg | Freq: Once | INTRAMUSCULAR | Status: AC
  Administered 2012-04-06: 10 mg via INTRAVENOUS
  Filled 2012-04-06: qty 1

## 2012-04-06 MED ORDER — ONDANSETRON HCL 4 MG/2ML IJ SOLN
4.0000 mg | Freq: Once | INTRAMUSCULAR | Status: AC
  Administered 2012-04-06: 4 mg via INTRAVENOUS
  Filled 2012-04-06: qty 2

## 2012-04-06 MED ORDER — KETOROLAC TROMETHAMINE 30 MG/ML IJ SOLN
30.0000 mg | Freq: Once | INTRAMUSCULAR | Status: AC
  Administered 2012-04-06: 30 mg via INTRAMUSCULAR
  Filled 2012-04-06: qty 1

## 2012-04-06 NOTE — ED Notes (Signed)
Patient was seen and treated at an urgent care yesterday for the same. The patient states that today the pain is worse. She tearful and weepin gin pain in triage

## 2012-04-06 NOTE — ED Notes (Signed)
While walking the patient back to room 7 the patient vomited about 400 cc's

## 2012-04-06 NOTE — ED Provider Notes (Signed)
History     CSN: [id number]  Arrival date & time 04/06/12  2126   First MD Initiated Contact with Patient 04/06/12 2229      Chief Complaint  Patient presents with  . Headache   HPI  History provided by the patient and mother. Patient is a 22 year old female with past history of recurrent headaches who presents with complaints of return of a headache today. Patient states that she has "migraine headaches" and was seen and treated yesterday for similar headache in urgent care Center. She does not recall what treatment she was given a felt much better today until her headache returned around 8 PM. Headache has gradually worsened through the evening. Pain has primary located on left side of her head and accompanied by photophobia and nausea vomiting. She also reports some hyperventilation with tingling and numbness feeling around her tongue and hands. Symptoms are also made worse with moving. Patient does report having aura sensation with spots in her vision. Patient has not been evaluated by a specialist for her symptoms. Denies any other aggravating or alleviating factors. Denies any associated fever, chills or sweats.     Past Medical History  Diagnosis Date  . Headache     Past Surgical History  Procedure Date  . No past surgeries     Family History  Problem Relation Age of Onset  . Anesthesia problems Neg Hx     History  Substance Use Topics  . Smoking status: Current Some Day Smoker  . Smokeless tobacco: Never Used  . Alcohol Use: No    OB History    Grav Para Term Preterm Abortions TAB SAB Ect Mult Living   1 1 1  0 0 0 0 0 0 1      Review of Systems  Constitutional: Negative for fever and chills.  Eyes: Positive for photophobia. Negative for pain.  Gastrointestinal: Positive for nausea and vomiting.  Neurological: Positive for numbness and headaches. Negative for weakness and light-headedness.  All other systems reviewed and are negative.    Allergies    Review of patient's allergies indicates no known allergies.  Home Medications   Current Outpatient Rx  Name  Route  Sig  Dispense  Refill  . ONDANSETRON 8 MG PO TBDP   Oral   Take 8 mg by mouth every 8 (eight) hours as needed. Nausea           BP 119/91  Pulse 81  Temp 98.3 F (36.8 C) (Oral)  Resp 25  SpO2 100%  LMP 02/26/2012  Physical Exam  Nursing note and vitals reviewed. Constitutional: She is oriented to person, place, and time. She appears well-developed and well-nourished. No distress.  HENT:  Head: Normocephalic and atraumatic.  Eyes: Conjunctivae normal and EOM are normal. Pupils are equal, round, and reactive to light.  Neck: Normal range of motion. Neck supple.       No meningeal signs  Cardiovascular: Normal rate and regular rhythm.   No murmur heard. Pulmonary/Chest: Effort normal and breath sounds normal. No respiratory distress. She has no wheezes. She has no rales.  Abdominal: Soft. There is no tenderness. There is no rigidity, no rebound, no guarding, no CVA tenderness and no tenderness at McBurney's point.  Neurological: She is alert and oriented to person, place, and time. She has normal strength. No cranial nerve deficit or sensory deficit. Gait normal.  Skin: Skin is warm and dry. No rash noted.  Psychiatric: She has a normal mood and affect. Her  behavior is normal.    ED Course  Procedures    1. Migraine with aura       MDM  Patient seen and evaluated. Patient appears uncomfortable.  Normal nonfocal neuro exam.   Patient reports feeling much better after medications and fluids. She has unremarkable nonfocal neuro exam and notify or concerning symptoms associated with headache today. Patient has had normal CT of the head 2012. At this time we'll discharge home.     Alexa [name], [place] 04/07/12 [phone]

## 2012-04-06 NOTE — ED Notes (Signed)
Patient also reports that she has numbness and tingling to her face and arm. The patient is tachyapnic in triage

## 2012-04-07 NOTE — ED Provider Notes (Signed)
Medical screening examination/treatment/procedure(s) were performed by non-physician practitioner and as supervising physician I was immediately available for consultation/collaboration.   Celene Kras, MD 04/07/12 [phone]

## 2012-04-16 ENCOUNTER — Inpatient Hospital Stay (HOSPITAL_COMMUNITY): Payer: Managed Care, Other (non HMO)

## 2012-04-16 ENCOUNTER — Encounter (HOSPITAL_COMMUNITY): Payer: Self-pay | Admitting: Advanced Practice Midwife

## 2012-04-16 ENCOUNTER — Inpatient Hospital Stay (HOSPITAL_COMMUNITY)
Admission: AD | Admit: 2012-04-16 | Discharge: 2012-04-16 | Disposition: A | Discharge status: Home / Self Care | Payer: Managed Care, Other (non HMO) | Source: Ambulatory Visit | Attending: Family Medicine | Admitting: Family Medicine

## 2012-04-16 ENCOUNTER — Other Ambulatory Visit (HOSPITAL_COMMUNITY): Payer: Managed Care, Other (non HMO)

## 2012-04-16 DIAGNOSIS — O99891 Other specified diseases and conditions complicating pregnancy: Secondary | ICD-10-CM | POA: Insufficient documentation

## 2012-04-16 DIAGNOSIS — R109 Unspecified abdominal pain: Secondary | ICD-10-CM | POA: Insufficient documentation

## 2012-04-16 DIAGNOSIS — N831 Corpus luteum cyst of ovary, unspecified side: Secondary | ICD-10-CM

## 2012-04-16 LAB — CBC
MCH: 28.6 pg (ref 26.0–34.0)
MCV: 85.2 fL (ref 78.0–100.0)
Platelets: 258 10*3/uL (ref 150–400)
RDW: 13.4 % (ref 11.5–15.5)
WBC: 8.8 10*3/uL (ref 4.0–10.5)

## 2012-04-16 LAB — URINALYSIS, ROUTINE W REFLEX MICROSCOPIC
Bilirubin Urine: NEGATIVE
Ketones, ur: NEGATIVE mg/dL
Leukocytes, UA: NEGATIVE
Nitrite: NEGATIVE
Specific Gravity, Urine: 1.025 (ref 1.005–1.030)
Urobilinogen, UA: 1 mg/dL (ref 0.0–1.0)

## 2012-04-16 NOTE — MAU Provider Note (Signed)
Chief Complaint: Abdominal Cramping  First Provider Initiated Contact with Patient 04/16/12 1657     SUBJECTIVE HPI: Alexa [name] is a 22 y.o. G1P1001 at 7.1 weeks by unsure LMP who presents with positive home UPT, cramping x a couple of weeks/ Denies vaginal bleeding, urinary complaints, GI complaints. Last BM today. No testing in this pregnancy.    Past Medical History  Diagnosis Date  . Headache    OB History    Grav Para Term Preterm Abortions TAB SAB Ect Mult Living   2 1 1  0 0 0 0 0 0 1     # Outc Date GA Lbr Len/2nd Wgt Sex Del Anes PTL Lv   1 TRM 1/13 [redacted]w[redacted]d 12:22 / 01:53 6lb14.2oz(3.125kg) M SVD EPI  Yes   2 CUR              Past Surgical History  Procedure Date  . No past surgeries    History   Social History  . Marital Status: Single    Spouse Name: N/A    Number of Children: N/A  . Years of Education: N/A   Occupational History  . Not on file.   Social History Main Topics  . Smoking status: Current Some Day Smoker  . Smokeless tobacco: Never Used  . Alcohol Use: No  . Drug Use: No  . Sexually Active:    Other Topics Concern  . Not on file   Social History Narrative   ** Merged History Encounter **    No current facility-administered medications on file prior to encounter.   Current Outpatient Prescriptions on File Prior to Encounter  Medication Sig Dispense Refill  . ondansetron (ZOFRAN-ODT) 8 MG disintegrating tablet Take 8 mg by mouth every 8 (eight) hours as needed. Nausea       No Known Allergies  ROS: Pertinent items in HPI  OBJECTIVE Blood pressure 108/57, pulse 69, temperature 97.8 F (36.6 C), temperature source Oral, resp. rate 18, height 5\' 3"  (1.6 m), weight 53.615 kg (118 lb 3.2 oz), last menstrual period 02/26/2012. GENERAL: Well-developed, well-nourished female in no acute distress.  HEENT: Normocephalic HEART: normal rate RESP: normal effort ABDOMEN: Soft, non-tender EXTREMITIES: Nontender, no edema NEURO:  Alert and oriented SPECULUM EXAM: NEFG, physiologic discharge, no blood noted, cervix clean BIMANUAL: cervix closed; uterus non-pregnant size, no adnexal tenderness or masses  LAB RESULTS Results for orders placed during the hospital encounter of 04/16/12 (from the past 24 hour(s))  URINALYSIS, ROUTINE W REFLEX MICROSCOPIC     Status: Normal   Collection Time   04/16/12  3:05 PM      Component Value Range   Color, Urine YELLOW  YELLOW   APPearance CLEAR  CLEAR   Specific Gravity, Urine 1.025  1.005 - 1.030   pH 6.5  5.0 - 8.0   Glucose, UA NEGATIVE  NEGATIVE mg/dL   Hgb urine dipstick NEGATIVE  NEGATIVE   Bilirubin Urine NEGATIVE  NEGATIVE   Ketones, ur NEGATIVE  NEGATIVE mg/dL   Protein, ur NEGATIVE  NEGATIVE mg/dL   Urobilinogen, UA 1.0  0.0 - 1.0 mg/dL   Nitrite NEGATIVE  NEGATIVE   Leukocytes, UA NEGATIVE  NEGATIVE  CBC     Status: Abnormal   Collection Time   04/16/12  3:27 PM      Component Value Range   WBC 8.8  4.0 - 10.5 K/uL   RBC 4.19  3.87 - 5.11 MIL/uL   Hemoglobin 12.0  12.0 - 15.0 g/dL  HCT 35.7 (*) 36.0 - 46.0 %   MCV 85.2  78.0 - 100.0 fL   MCH 28.6  26.0 - 34.0 pg   MCHC 33.6  30.0 - 36.0 g/dL   RDW 16.1  09.6 - 04.5 %   Platelets 258  150 - 400 K/uL  HCG, QUANTITATIVE, PREGNANCY     Status: Abnormal   Collection Time   04/16/12  3:28 PM      Component Value Range   hCG, Beta Chain, Quant, [place] [address] (*) <5 mIU/mL  WET PREP, GENITAL     Status: Abnormal   Collection Time   04/16/12  5:15 PM      Component Value Range   Yeast Wet Prep HPF POC NONE SEEN  NONE SEEN   Trich, Wet Prep NONE SEEN  NONE SEEN   Clue Cells Wet Prep HPF POC NONE SEEN  NONE SEEN   WBC, Wet Prep HPF POC MODERATE (*) NONE SEEN    IMAGING 6.0 week SIUP. Pos cardiac activity. Left CLC, small SCH.   MAU COURSE  ASSESSMENT 1. Corpus luteum cyst    PLAN Discharge home     Follow-up Information    Follow up with [facility].      Follow up with [facility]  [facility] ADMISS[facility]. (As needed if symptoms worsen)    Contact information:   [address] [id number] mc [place] [address] [phone]          Medication List     As of 04/16/2012  9:32 PM    STOP taking these medications         ibuprofen 600 MG tablet   Commonly known as: ADVIL,MOTRIN      TAKE these medications         ondansetron 8 MG disintegrating tablet   Commonly known as: ZOFRAN-ODT   Take 8 mg by mouth every 8 (eight) hours as needed. Nausea        [place], [place] 04/16/2012  3:26 PM

## 2012-04-16 NOTE — MAU Note (Signed)
"  I've been cramping for about 2 wks.  (+) UPT about 03/29/12.  No VB.  A lot N/V and migraines.  I didn't have that as much in my last pg."

## 2012-04-16 NOTE — MAU Note (Signed)
Positive UPT at home, unsure but thinks LMP 02/21/12, onset of cramping for a couple of weeks, no vaginal bleeding.

## 2012-04-18 NOTE — MAU Provider Note (Signed)
Chart reviewed and agree with management and plan.  

## 2012-04-22 ENCOUNTER — Encounter: Payer: Self-pay | Admitting: Neurology

## 2012-04-22 DIAGNOSIS — G43109 Migraine with aura, not intractable, without status migrainosus: Secondary | ICD-10-CM

## 2012-05-25 ENCOUNTER — Encounter: Payer: Self-pay | Admitting: Neurology

## 2012-05-25 DIAGNOSIS — G43909 Migraine, unspecified, not intractable, without status migrainosus: Secondary | ICD-10-CM | POA: Insufficient documentation

## 2012-05-25 DIAGNOSIS — G43109 Migraine with aura, not intractable, without status migrainosus: Secondary | ICD-10-CM | POA: Insufficient documentation

## 2012-06-20 ENCOUNTER — Ambulatory Visit: Payer: Managed Care, Other (non HMO) | Admitting: Neurology

## 2012-06-22 ENCOUNTER — Ambulatory Visit: Payer: Managed Care, Other (non HMO) | Admitting: Family Medicine

## 2012-06-22 VITALS — BP 110/70 | HR 58 | Temp 98.8°F | Resp 16 | Ht 63.0 in | Wt 115.0 lb

## 2012-06-22 DIAGNOSIS — Z3009 Encounter for other general counseling and advice on contraception: Secondary | ICD-10-CM

## 2012-06-22 DIAGNOSIS — Z30011 Encounter for initial prescription of contraceptive pills: Secondary | ICD-10-CM

## 2012-06-22 MED ORDER — DROSPIRENONE-ETHINYL ESTRADIOL 3-0.02 MG PO TABS: 1.0000 | ORAL_TABLET | Freq: Every day | ORAL | Status: DC

## 2012-06-22 NOTE — Progress Notes (Signed)
Subjective: 22 year old gravida 1 para 1 female of [place] extract having lived in the [place] since she was 22 years old. 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 She lives with her boyfriend his father for child. She is not working a job right now. After having had her child they placed her on another contraception that she does not know the name of. Prior to that some time ago she had been on Yaz an d liked it.  Objective: Nonsmoker, healthy. No physical exam done she does not need STD testing because she had testing done during pregnancy and has only been with her boyfriend since then, and she believes they are in a mutual mutually monogamous situation..  Assessment: Oral contraception  Plan:yaz

## 2012-06-22 NOTE — Patient Instructions (Signed)
Contraception Choices Contraception (birth control) is the use of any methods or devices to prevent pregnancy. Below are some methods to help avoid pregnancy. HORMONAL METHODS   Contraceptive implant. This is a thin, plastic tube containing progesterone hormone. It does not contain estrogen hormone. Your caregiver inserts the tube in the inner part of the upper arm. The tube can remain in place for up to 3 years. After 3 years, the implant must be removed. The implant prevents the ovaries from releasing an egg (ovulation), thickens the cervical mucus which prevents sperm from entering the uterus, and thins the lining of the inside of the uterus.  Progesterone-only injections. These injections are given every 3 months by your caregiver to prevent pregnancy. This synthetic progesterone hormone stops the ovaries from releasing eggs. It also thickens cervical mucus and changes the uterine lining. This makes it harder for sperm to survive in the uterus.  Birth control pills. These pills contain estrogen and progesterone hormone. They work by stopping the egg from forming in the ovary (ovulation). Birth control pills are prescribed by a caregiver.Birth control pills can also be used to treat heavy periods.  Minipill. This type of birth control pill contains only the progesterone hormone. They are taken every day of each month and must be prescribed by your caregiver.  Birth control patch. The patch contains hormones similar to those in birth control pills. It must be changed once a week and is prescribed by a caregiver.  Vaginal ring. The ring contains hormones similar to those in birth control pills. It is left in the vagina for 3 weeks, removed for 1 week, and then a new one is put back in place. The patient must be comfortable inserting and removing the ring from the vagina.A caregiver's prescription is necessary.  BARRIER METHODS   Female condom. This is a thin sheath (latex or rubber) that is worn  over the penis during sexual intercourse. It can be used with spermicide to increase effectiveness.  Female condom. This is a soft, loose-fitting sheath that is put into the vagina before sexual intercourse.  Diaphragm. This is a soft, latex, dome-shaped barrier that must be fitted by a caregiver. It is inserted into the vagina, along with a spermicidal jelly. It is inserted before intercourse. The diaphragm should be left in the vagina for 6 to 8 hours after intercourse.  Cervical cap. This is a round, soft, latex or plastic cup that fits over the cervix and must be fitted by a caregiver. The cap can be left in place for up to 48 hours after intercourse.  Sponge. This is a soft, circular piece of polyurethane foam. The sponge has spermicide in it. It is inserted into the vagina after wetting it and before sexual intercourse.  Spermicides. These are chemicals that kill or block sperm from entering the cervix and uterus. They come in the form of creams, jellies, suppositories, foam, or tablets. They do not require a prescription. They are inserted into the vagina with an applicator before having sexual intercourse. The process must be repeated every time you have sexual intercourse. INTRAUTERINE CONTRACEPTION  Intrauterine device (IUD). This is a T-shaped device that is put in a woman's uterus during a menstrual period to prevent pregnancy. There are 2 types:  Copper IUD. This type of IUD is wrapped in copper wire and is placed inside the uterus. Copper makes the uterus and fallopian tubes produce a fluid that kills sperm. It can stay in place for 10 years.  Hormone IUD. This type of IUD contains the hormone progestin (synthetic progesterone). The hormone thickens the cervical mucus and prevents sperm from entering the uterus, and it also thins the uterine lining to prevent implantation of a fertilized egg. The hormone can weaken or kill the sperm that get into the uterus. It can stay in place for 5  years. PERMANENT METHODS OF CONTRACEPTION  Female tubal ligation. This is when the woman's fallopian tubes are surgically sealed, tied, or blocked to prevent the egg from traveling to the uterus.  Female sterilization. This is when the female has the tubes that carry sperm tied off (vasectomy).This blocks sperm from entering the vagina during sexual intercourse. After the procedure, the man can still ejaculate fluid (semen). NATURAL PLANNING METHODS  Natural family planning. This is not having sexual intercourse or using a barrier method (condom, diaphragm, cervical cap) on days the woman could become pregnant.  Calendar method. This is keeping track of the length of each menstrual cycle and identifying when you are fertile.  Ovulation method. This is avoiding sexual intercourse during ovulation.  Symptothermal method. This is avoiding sexual intercourse during ovulation, using a thermometer and ovulation symptoms.  Post-ovulation method. This is timing sexual intercourse after you have ovulated. Regardless of which type or method of contraception you choose, it is important that you use condoms to protect against the transmission of sexually transmitted diseases (STDs). Talk with your caregiver about which form of contraception is most appropriate for you. Document Released: 03/20/2005 Document Revised: 06/12/2011 Document Reviewed: 07/27/2010 [facility] Patient Information 2013 [place], [place].

## 2012-07-10 ENCOUNTER — Ambulatory Visit: Payer: Self-pay | Admitting: Neurology

## 2012-09-27 IMAGING — CT CT HEAD W/O CM
2 series · 16 of 30 positions shown, 20 images · non-contrast
Comparison: None.

CLINICAL DATA: 19-year-old female with headache, extremity
numbness, nausea, 10 weeks pregnant.

CT HEAD WITHOUT CONTRAST
TECHNIQUE: Contiguous axial images were obtained from the base of
the skull through the vertex without contrast.

[Series 2: head w/o · axial · non-contrast · 0.41mm/px · z∈[+50,+180]mm · 13 of 32 slices shown, 17 images]
[im 3/32  brain]
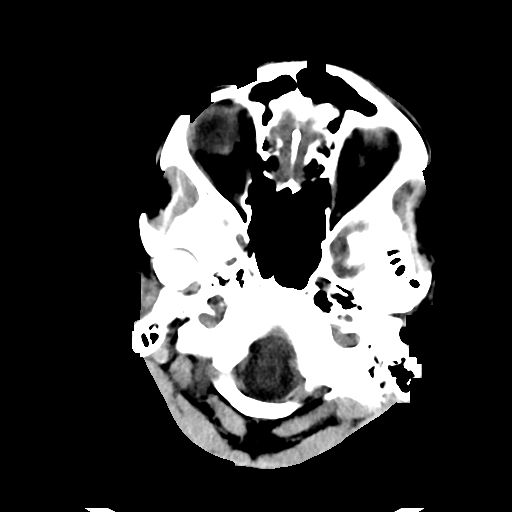
[im 3/32  bone]
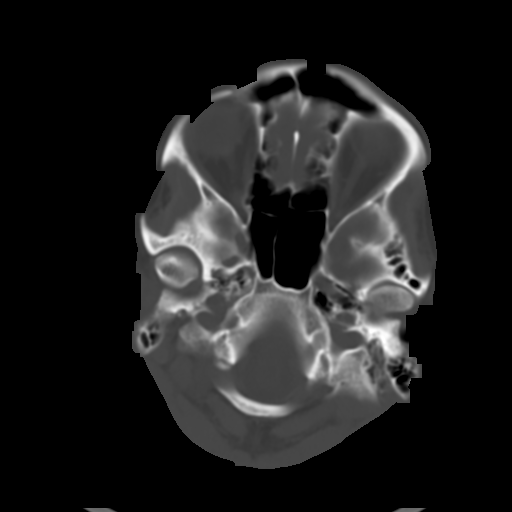
[im 5/32  brain]
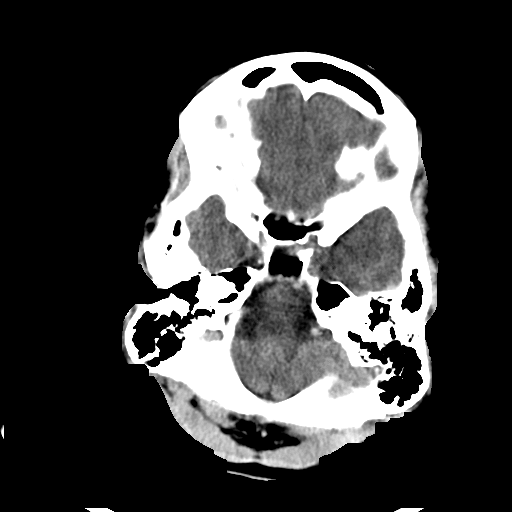
[im 7/32  brain]
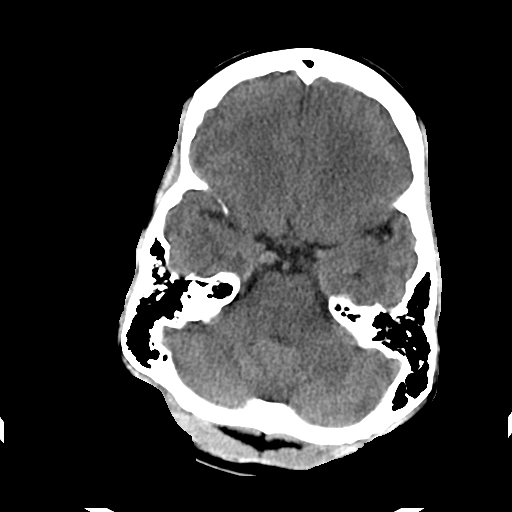
[im 9/32  brain]
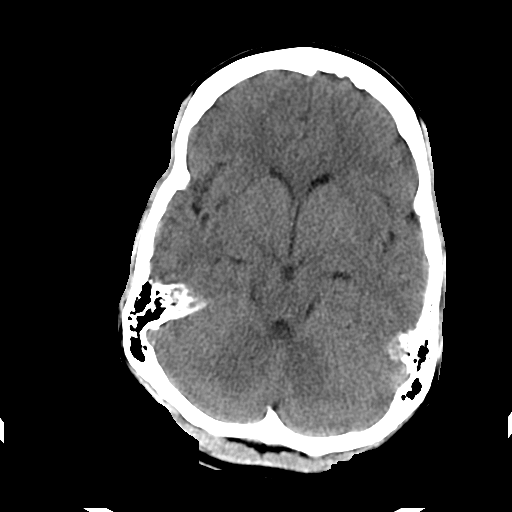
[im 12/32  brain]
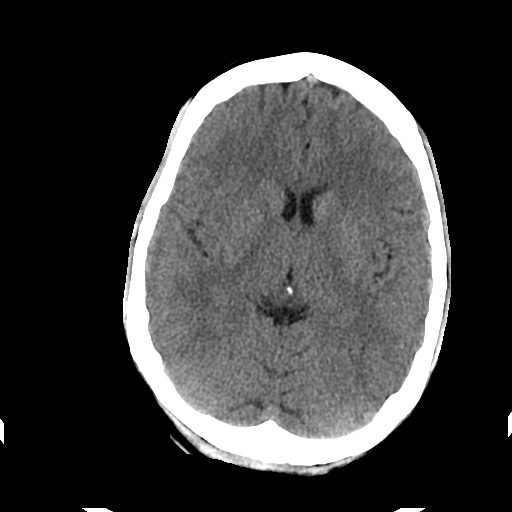
[im 12/32  bone]
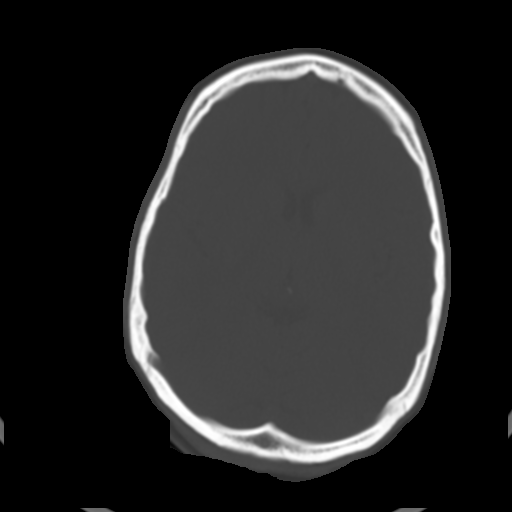
[im 14/32  brain]
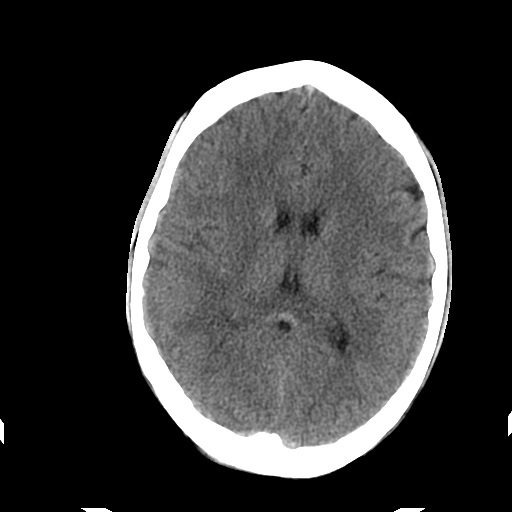
[im 16/32  brain]
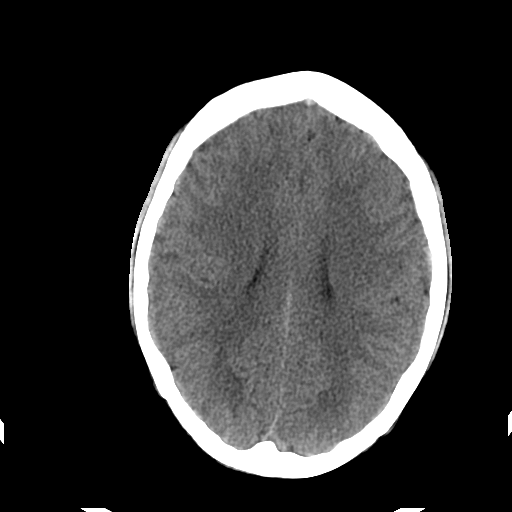
[im 18/32  brain]
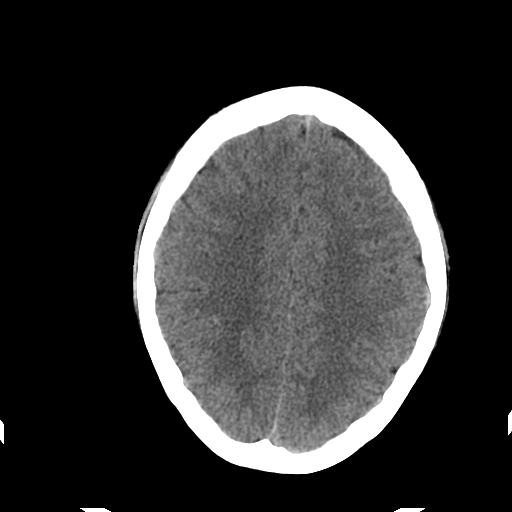
[im 20/32  brain]
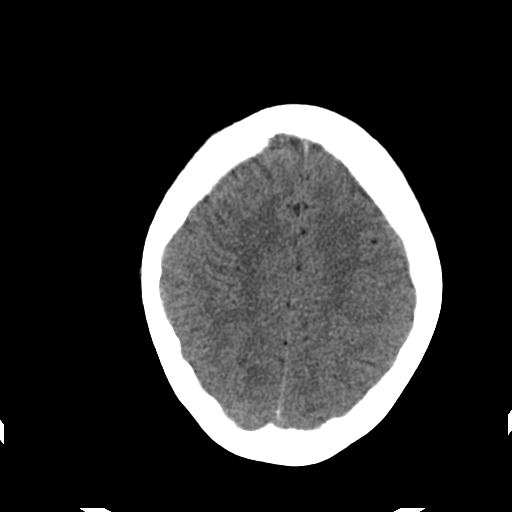
[im 20/32  bone]
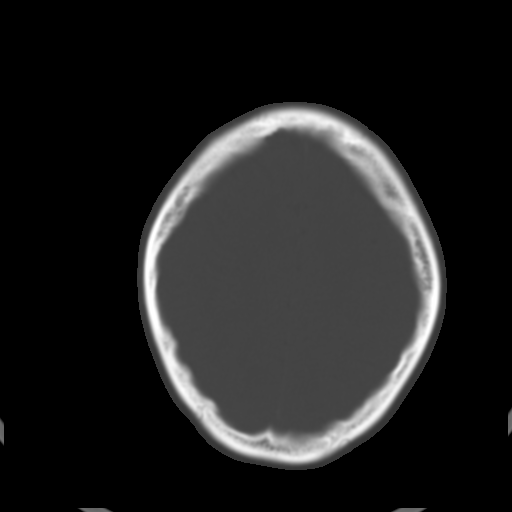
[im 23/32  brain]
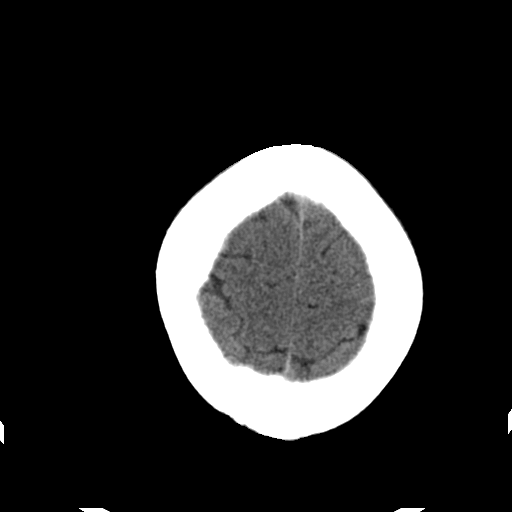
[im 25/32  brain]
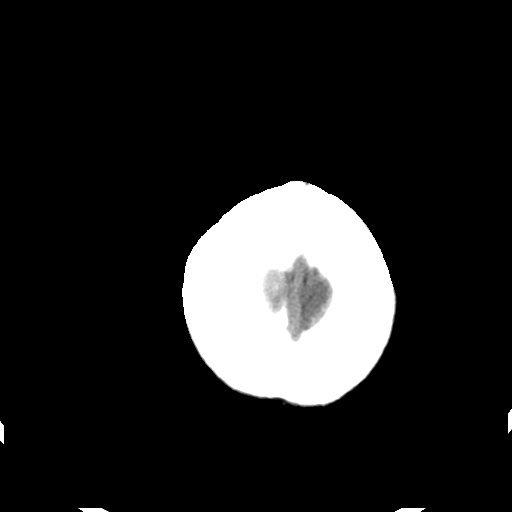
[im 27/32  brain]
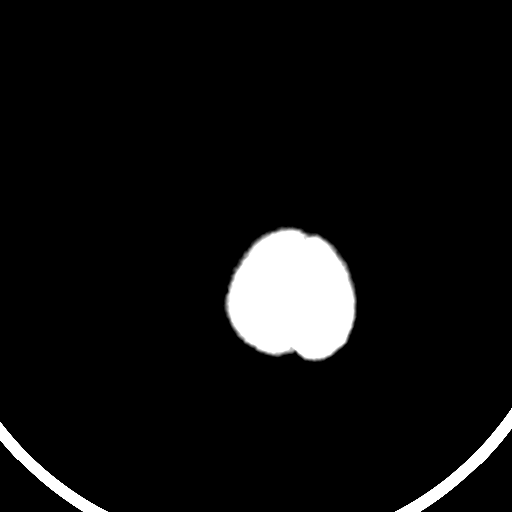
[im 29/32  brain]
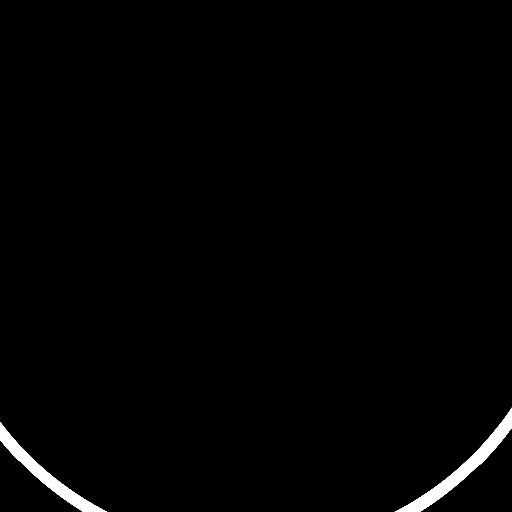
[im 29/32  bone]
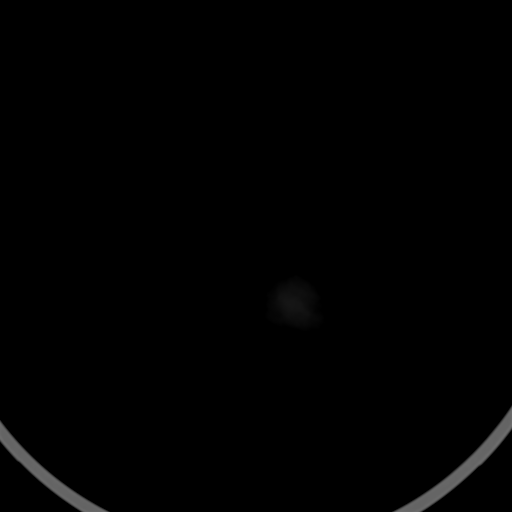

[Series 3: head w/o bone · axial · non-contrast · 0.41mm/px · z∈[+50,+94]mm · 3 of 32 slices shown]
[im 3/32  bone]
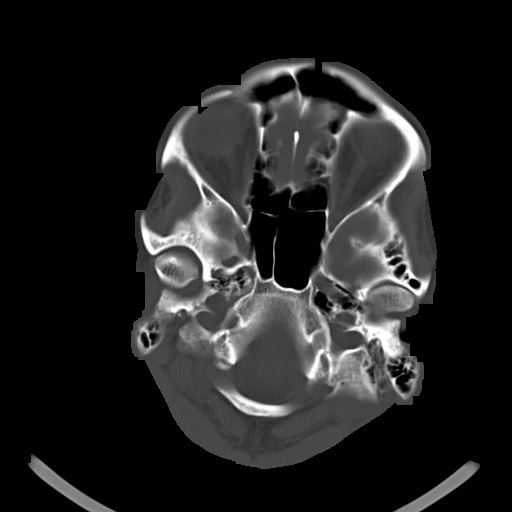
[im 7/32  bone]
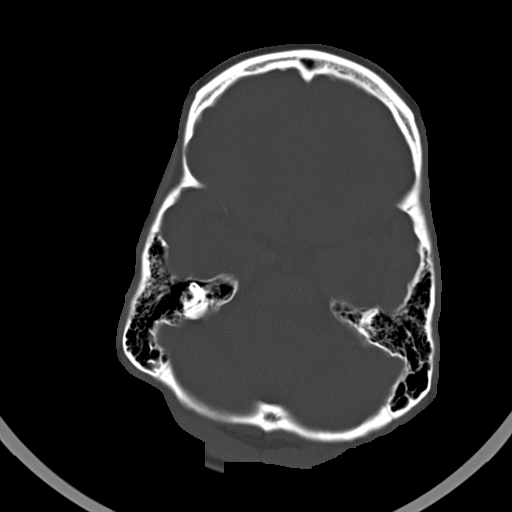
[im 12/32  bone]
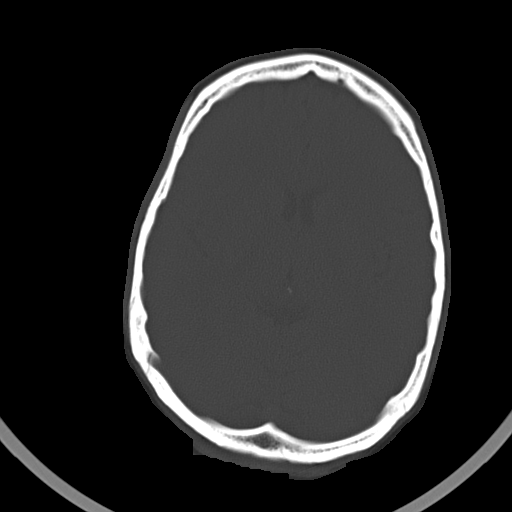

[16 of 30 positions shown; findings below may reference images not displayed]

FINDINGS: The patient's abdomen was shielded for this exam.

Visualized orbits and scalp soft tissues are within normal limits.
Visualized paranasal sinuses and mastoids are clear.  No acute
osseous abnormality identified.

Cerebral volume is within normal limits for age.  No midline shift,
ventriculomegaly, mass effect, evidence of mass lesion,
intracranial hemorrhage or evidence of cortically based acute
infarction.  Gray-white matter differentiation is within normal
limits throughout the brain.  No suspicious intracranial vascular
hyperdensity.
IMPRESSION: Normal noncontrast CT appearance of the brain.

## 2012-12-15 ENCOUNTER — Encounter (HOSPITAL_COMMUNITY): Payer: Self-pay

## 2012-12-15 ENCOUNTER — Emergency Department (HOSPITAL_COMMUNITY)
Admission: EM | Admit: 2012-12-15 | Discharge: 2012-12-15 | Disposition: A | Discharge status: Home / Self Care | Payer: Managed Care, Other (non HMO) | Attending: Emergency Medicine | Admitting: Emergency Medicine

## 2012-12-15 DIAGNOSIS — H53149 Visual discomfort, unspecified: Secondary | ICD-10-CM | POA: Insufficient documentation

## 2012-12-15 DIAGNOSIS — G43909 Migraine, unspecified, not intractable, without status migrainosus: Secondary | ICD-10-CM

## 2012-12-15 DIAGNOSIS — H93239 Hyperacusis, unspecified ear: Secondary | ICD-10-CM | POA: Insufficient documentation

## 2012-12-15 DIAGNOSIS — Z3202 Encounter for pregnancy test, result negative: Secondary | ICD-10-CM | POA: Insufficient documentation

## 2012-12-15 DIAGNOSIS — Z79899 Other long term (current) drug therapy: Secondary | ICD-10-CM | POA: Insufficient documentation

## 2012-12-15 DIAGNOSIS — F17200 Nicotine dependence, unspecified, uncomplicated: Secondary | ICD-10-CM | POA: Insufficient documentation

## 2012-12-15 DIAGNOSIS — Z8679 Personal history of other diseases of the circulatory system: Secondary | ICD-10-CM | POA: Insufficient documentation

## 2012-12-15 LAB — POCT PREGNANCY, URINE: Preg Test, Ur: NEGATIVE

## 2012-12-15 MED ORDER — SODIUM CHLORIDE 0.9 % IV BOLUS (SEPSIS)
1000.0000 mL | Freq: Once | INTRAVENOUS | Status: AC
  Administered 2012-12-15: 1000 mL via INTRAVENOUS

## 2012-12-15 MED ORDER — KETOROLAC TROMETHAMINE 30 MG/ML IJ SOLN
30.0000 mg | Freq: Once | INTRAMUSCULAR | Status: AC
  Administered 2012-12-15: 30 mg via INTRAVENOUS
  Filled 2012-12-15: qty 1

## 2012-12-15 MED ORDER — DIPHENHYDRAMINE HCL 50 MG/ML IJ SOLN
INTRAMUSCULAR | Status: AC
  Filled 2012-12-15: qty 1

## 2012-12-15 MED ORDER — BUTALBITAL-ASPIRIN-CAFFEINE 50-325-40 MG PO CAPS: 1.0000 | ORAL_CAPSULE | Freq: Two times a day (BID) | ORAL | Status: DC | PRN

## 2012-12-15 MED ORDER — DIPHENHYDRAMINE HCL 50 MG/ML IJ SOLN
25.0000 mg | Freq: Once | INTRAMUSCULAR | Status: AC
  Administered 2012-12-15: 25 mg via INTRAVENOUS
  Filled 2012-12-15: qty 1

## 2012-12-15 MED ORDER — METOCLOPRAMIDE HCL 5 MG/ML IJ SOLN
10.0000 mg | Freq: Once | INTRAMUSCULAR | Status: AC
  Administered 2012-12-15: 10 mg via INTRAVENOUS
  Filled 2012-12-15: qty 2

## 2012-12-15 NOTE — ED Provider Notes (Signed)
TIME SEEN: 9:23 AM  CHIEF COMPLAINT: Migraine  HPI: Patient is a 22 y.o. female with a history of migraine headaches who presents emergency department with her typical migraine headache. She states she is having right-sided throbbing headache that is moderate in severity. No radiation. She states it is worse with noise, lites and movement. She states some mild improvement her headache with Tylenol and ibuprofen at home. She states this headache has been present intermittently for the past week and has been present today for 3 hours. No numbness, tingling or focal weakness. No fever, neck pain or neck stiffness. She states this was a gradual onset headache and is typical of her prior migraines.  ROS: See HPI Constitutional: no fever  Eyes: no drainage  ENT: no runny nose   Cardiovascular:  no chest pain  Resp: no SOB  GI: no vomiting GU: no dysuria Integumentary: no rash  Allergy: no hives  Musculoskeletal: no leg swelling  Neurological: no slurred speech ROS otherwise negative  PAST MEDICAL HISTORY/PAST SURGICAL HISTORY:  Past Medical History  Diagnosis Date  . Headache(784.0)     MEDICATIONS:  Prior to Admission medications   Medication Sig Start Date End Date Taking? Authorizing Provider  drospirenone-ethinyl estradiol (YAZ) 3-0.02 MG tablet Take 1 tablet by mouth daily. 06/22/12   Peyton Najjar, MD  ondansetron (ZOFRAN-ODT) 8 MG disintegrating tablet Take 8 mg by mouth every 8 (eight) hours as needed. Nausea 04/05/12   Phillips Odor, MD    ALLERGIES:  No Known Allergies  SOCIAL HISTORY:  History  Substance Use Topics  . Smoking status: Current Some Day Smoker    Types: Cigarettes  . Smokeless tobacco: Never Used  . Alcohol Use: No    FAMILY HISTORY: Family History  Problem Relation Age of Onset  . Anesthesia problems Neg Hx   . Hypertension Father   . Heart disease Maternal Grandmother   . Hypertension Maternal Grandfather     EXAM: BP 115/61  Pulse 78   Temp(Src) 98.8 F (37.1 C) (Oral)  Resp 16  Ht 5\' 3"  (1.6 m)  Wt 114 lb (51.71 kg)  BMI 20.2 kg/m2  SpO2 100%  LMP 12/08/2012  Breastfeeding? No CONSTITUTIONAL: Alert and oriented and responds appropriately to questions. Well-appearing; well-nourished HEAD: Normocephalic EYES: Conjunctivae clear, PERRL ENT: normal nose; no rhinorrhea; moist mucous membranes; pharynx without lesions noted NECK: Supple, no meningismus, no LAD  CARD: RRR; S1 and S2 appreciated; no murmurs, no clicks, no rubs, no gallops RESP: Normal chest excursion without splinting or tachypnea; breath sounds clear and equal bilaterally; no wheezes, no rhonchi, no rales,  ABD/GI: Normal bowel sounds; non-distended; soft, non-tender, no rebound, no guarding BACK:  The back appears normal and is non-tender to palpation, there is no CVA tenderness EXT: Normal ROM in all joints; non-tender to palpation; no edema; normal capillary refill; no cyanosis    SKIN: Normal color for age and race; warm NEURO: Moves all extremities equally, no pronator drift, strength 5/5 in all 4 extremities, cranial nerves II through XII intact, sensation to light touch intact diffusely PSYCH: The patient's mood and manner are appropriate. Grooming and personal hygiene are appropriate.  MEDICAL DECISION MAKING: Patient with typical migraine headache. She is neurologically intact on exam. No infectious symptoms. Patient has been to the emergency department multiple times in the past for symptom control. She does not have a PCP and is not on any prophylactic medications. Will give portal, Reglan, Benadryl and IV fluids. Patient also asking for  pregnancy test today. Have recommended to patient and her mother that she get a primary care physician to discuss prophylactic medications given patient reports migraine headaches weekly.  ED PROGRESS: Patient reports her headache is completely gone. Pregnancy test negative. We'll discharge home with return  precautions, information for PCP followup, prescription for Fioricet. Patient and mother at bedside verbalize understanding and are comfortable with this plan.    Layla Maw Neah Sporrer, DO 12/15/12 1055

## 2012-12-15 NOTE — ED Notes (Signed)
Patient c/o migraine headache. Patient states she has a history of migraines. Patient states she took Tylenol 500 mg 2 hours ago with no relief. Patient c/o vomiting, and increased pain with loud noises.

## 2013-02-18 ENCOUNTER — Encounter (HOSPITAL_COMMUNITY): Payer: Self-pay | Admitting: Emergency Medicine

## 2013-02-18 ENCOUNTER — Emergency Department (HOSPITAL_COMMUNITY)
Admission: EM | Admit: 2013-02-18 | Discharge: 2013-02-18 | Disposition: A | Discharge status: Home / Self Care | Payer: Managed Care, Other (non HMO) | Attending: Emergency Medicine | Admitting: Emergency Medicine

## 2013-02-18 DIAGNOSIS — J029 Acute pharyngitis, unspecified: Secondary | ICD-10-CM | POA: Insufficient documentation

## 2013-02-18 DIAGNOSIS — R52 Pain, unspecified: Secondary | ICD-10-CM | POA: Insufficient documentation

## 2013-02-18 DIAGNOSIS — F17200 Nicotine dependence, unspecified, uncomplicated: Secondary | ICD-10-CM | POA: Insufficient documentation

## 2013-02-18 LAB — RAPID STREP SCREEN (MED CTR MEBANE ONLY): Streptococcus, Group A Screen (Direct): NEGATIVE

## 2013-02-18 MED ORDER — IBUPROFEN 200 MG PO TABS
600.0000 mg | ORAL_TABLET | Freq: Once | ORAL | Status: AC
  Administered 2013-02-18: 600 mg via ORAL
  Filled 2013-02-18: qty 3

## 2013-02-18 NOTE — ED Provider Notes (Signed)
CSN: [id number]     Arrival date & time 02/18/13  1736 History   First MD Initiated Contact with Patient 02/18/13 1815     Chief Complaint  Patient presents with  . Sore Throat  . Generalized Body Aches   (Consider location/radiation/quality/duration/timing/severity/associated sxs/prior Treatment) HPI Comments: Patient presents emergency department with chief complaint of sore throat that started this morning. She also endorses nasal congestion and body aches. She denies any sick contacts. She denies fevers or chills. She states that she is tried taking Tylenol with no relief. Nothing makes his symptoms better or worse. She states that she would like something to help her feel better. Denies any chest pain, shortness of breath, abdominal pain, dysuria, or vaginal discharge.  The history is provided by the patient. No language interpreter was used.    Past Medical History  Diagnosis Date  . WUJWJXBJ(478.2)    Past Surgical History  Procedure Laterality Date  . No past surgeries     Family History  Problem Relation Age of Onset  . Anesthesia problems Neg Hx   . Hypertension Father   . Heart disease Maternal Grandmother   . Hypertension Maternal Grandfather    History  Substance Use Topics  . Smoking status: Current Some Day Smoker    Types: Cigarettes  . Smokeless tobacco: Never Used  . Alcohol Use: No   OB History   Grav Para Term Preterm Abortions TAB SAB Ect Mult Living   2 1 1  0 0 0 0 0 0 1     Review of Systems  All other systems reviewed and are negative.    Allergies  Review of patient's allergies indicates no known allergies.  Home Medications   Current Outpatient Rx  Name  Route  Sig  Dispense  Refill  . acetaminophen (TYLENOL) 500 MG tablet   Oral   Take 500 mg by mouth every 6 (six) hours as needed for moderate pain or fever (back pain).         Marland Kitchen levonorgestrel (MIRENA) 20 MCG/24HR IUD   Intrauterine   1 each by Intrauterine route once.           BP 110/62  Pulse 87  Temp(Src) 99.6 F (37.6 C) (Oral)  Resp 16  SpO2 98%  LMP 02/04/2013 Physical Exam  Nursing note and vitals reviewed. Constitutional: She is oriented to person, place, and time. She appears well-developed and well-nourished.  HENT:  Head: Normocephalic and atraumatic.  Mildly erythematous oropharynx, no exudates, no signs of tonsillar or peritonsillar abscess, uvula is midline, airway is intact  Eyes: Conjunctivae and EOM are normal. Pupils are equal, round, and reactive to light.  Neck: Normal range of motion. Neck supple.  Cardiovascular: Normal rate and regular rhythm.  Exam reveals no gallop and no friction rub.   No murmur heard. Pulmonary/Chest: Effort normal and breath sounds normal. No respiratory distress. She has no wheezes. She has no rales. She exhibits no tenderness.  Abdominal: Soft. Bowel sounds are normal. She exhibits no distension and no mass. There is no tenderness. There is no rebound and no guarding.  Musculoskeletal: Normal range of motion. She exhibits no edema and no tenderness.  Neurological: She is alert and oriented to person, place, and time.  Skin: Skin is warm and dry.  Psychiatric: She has a normal mood and affect. Her behavior is normal. Judgment and thought content normal.    ED Course  Procedures (including critical care time) Results for orders placed during the  hospital encounter of 02/18/13  RAPID STREP SCREEN      Result Value Range   Streptococcus, Group A Screen (Direct) NEGATIVE  NEGATIVE   No results found.   EKG Interpretation   None       MDM   1. Viral pharyngitis      Pt afebrile without tonsillar exudate, negative strep. Presents with mild cervical lymphadenopathy, & dysphagia; diagnosis of viral pharyngitis. No abx indicated. DC w symptomatic tx for pain  Pt does not appear dehydrated, but did discuss importance of water rehydration. Presentation non concerning for PTA or infxn spread to soft  tissue. No trismus or uvula deviation. Specific return precautions discussed. Pt able to drink water in ED without difficulty with intact air way. Recommended PCP follow up.  Filed Vitals:   02/18/13 1949  BP: 99/57  Pulse: 83  Temp: 99.2 F (37.3 C)  Resp: [address]        Roxy Horseman, [place] 02/18/13 1952

## 2013-02-18 NOTE — ED Notes (Signed)
Pt presents to ed with c/o sore throat, congestion and body aches since this morning, denies sick contact.

## 2013-02-19 NOTE — ED Provider Notes (Signed)
Medical screening examination/treatment/procedure(s) were performed by non-physician practitioner and as supervising physician I was immediately available for consultation/collaboration.  EKG Interpretation   None         Gwyneth Sprout, MD 02/19/13 1601

## 2013-02-20 LAB — CULTURE, GROUP A STREP

## 2014-02-02 ENCOUNTER — Encounter (HOSPITAL_COMMUNITY): Payer: Self-pay | Admitting: Emergency Medicine

## 2014-06-28 ENCOUNTER — Encounter (HOSPITAL_COMMUNITY): Payer: Self-pay | Admitting: Oncology

## 2014-06-28 ENCOUNTER — Emergency Department (HOSPITAL_COMMUNITY)
Admission: EM | Admit: 2014-06-28 | Discharge: 2014-06-28 | Disposition: A | Discharge status: Home / Self Care | Payer: BLUE CROSS/BLUE SHIELD | Attending: Emergency Medicine | Admitting: Emergency Medicine

## 2014-06-28 DIAGNOSIS — X58XXXA Exposure to other specified factors, initial encounter: Secondary | ICD-10-CM | POA: Insufficient documentation

## 2014-06-28 DIAGNOSIS — Z720 Tobacco use: Secondary | ICD-10-CM | POA: Insufficient documentation

## 2014-06-28 DIAGNOSIS — Y998 Other external cause status: Secondary | ICD-10-CM | POA: Diagnosis not present

## 2014-06-28 DIAGNOSIS — Y9289 Other specified places as the place of occurrence of the external cause: Secondary | ICD-10-CM | POA: Diagnosis not present

## 2014-06-28 DIAGNOSIS — S0300XA Dislocation of jaw, unspecified side, initial encounter: Secondary | ICD-10-CM

## 2014-06-28 DIAGNOSIS — Y9389 Activity, other specified: Secondary | ICD-10-CM | POA: Insufficient documentation

## 2014-06-28 DIAGNOSIS — R112 Nausea with vomiting, unspecified: Secondary | ICD-10-CM | POA: Diagnosis not present

## 2014-06-28 DIAGNOSIS — S030XXA Dislocation of jaw, initial encounter: Secondary | ICD-10-CM | POA: Diagnosis present

## 2014-06-28 MED ORDER — SODIUM CHLORIDE 0.9 % IV BOLUS (SEPSIS)
1000.0000 mL | Freq: Once | INTRAVENOUS | Status: AC
  Administered 2014-06-28: 1000 mL via INTRAVENOUS

## 2014-06-28 MED ORDER — DIAZEPAM 5 MG PO TABS: 5.0000 mg | ORAL_TABLET | Freq: Three times a day (TID) | ORAL | Status: DC | PRN

## 2014-06-28 MED ORDER — ONDANSETRON 8 MG PO TBDP: 8.0000 mg | ORAL_TABLET | Freq: Three times a day (TID) | ORAL | Status: DC | PRN

## 2014-06-28 MED ORDER — ONDANSETRON HCL 4 MG/2ML IJ SOLN: 4.0000 mg | Freq: Once | INTRAMUSCULAR | Status: DC

## 2014-06-28 MED ORDER — ONDANSETRON HCL 4 MG/2ML IJ SOLN
4.0000 mg | Freq: Once | INTRAMUSCULAR | Status: AC
  Administered 2014-06-28: 4 mg via INTRAVENOUS

## 2014-06-28 MED ORDER — DIAZEPAM 5 MG/ML IJ SOLN
5.0000 mg | Freq: Once | INTRAMUSCULAR | Status: AC
  Administered 2014-06-28: 5 mg via INTRAVENOUS
  Filled 2014-06-28: qty 2

## 2014-06-28 NOTE — ED Notes (Signed)
Awake. Verbally responsive. A/O x4. Resp even and unlabored. No audible adventitious breath sounds noted. ABC's intact.  

## 2014-06-28 NOTE — ED Notes (Signed)
Resting quietly with eye closed. Easily arousable. Verbally responsive. Resp even and unlabored. ABC's intact. ST on monitor at 106bpm. IV infusing NS at 99599ml/hr without difficulty.

## 2014-06-28 NOTE — ED Notes (Signed)
Per pt's significant other pt was vomiting and her jaw became locked.  Pt is unable to close her mouth or speak.  Pt vomited in triage and is unwilling to sit up concerning for aspiration.  Per pt's significant other pt does have ETOH on board.

## 2014-06-28 NOTE — ED Notes (Signed)
Pt awake. Verbally responsive. Resp even and unlabored. No audible adventitious breath sounds. ABC's intact. IV infusing NS at 94799ml/hr without difficulty. Friend at bedside.

## 2014-06-28 NOTE — ED Notes (Signed)
Awake. Verbally responsive. A/O x4. Resp even and unlabored. No audible adventitious breath sounds noted. ABC's intact. Pt unable to close mouth d/t lock jaw at this time. Dr. Rolley SimsMolphus at bedside. Friend at bedside.

## 2014-06-28 NOTE — ED Provider Notes (Signed)
CSN: [id number]     Arrival date & time 06/28/14  0151 History   First MD Initiated Contact with Patient 06/28/14 0248     Chief Complaint  Patient presents with  . Jaw Locked      (Consider location/radiation/quality/duration/timing/severity/associated sxs/prior Treatment) HPI  Level 5 Caveat: unable to speak. This is a 24 year old female who developed nausea and vomiting about 1 AM. She vomited multiple times over the next 90 minutes. As a result of vomiting her jaw is now locked in the open position and she is unable to close her mouth. She is having moderate to severe pain as well as anxiety as a result. She denies abdominal pain or diarrhea. She has had one episode of her jaw being locked open in the past. She was given Zofran 4 milligrams IV prior to my evaluation per protocol with improvement in her nausea.  Past Medical History  Diagnosis Date  . WUJWJXBJ(478.2Headache(784.0)    Past Surgical History  Procedure Laterality Date  . No past surgeries     Family History  Problem Relation Age of Onset  . Anesthesia problems Neg Hx   . Hypertension Father   . Heart disease Maternal Grandmother   . Hypertension Maternal Grandfather    History  Substance Use Topics  . Smoking status: Current Some Day Smoker    Types: Cigarettes  . Smokeless tobacco: Never Used  . Alcohol Use: Yes   OB History    Gravida Para Term Preterm AB TAB SAB Ectopic Multiple Living   2 1 1  0 0 0 0 0 0 1     Review of Systems  Unable to perform ROS   Allergies  Review of patient's allergies indicates no known allergies.  Home Medications   Prior to Admission medications   Medication Sig Start Date End Date Taking? Authorizing Provider  acetaminophen (TYLENOL) 500 MG tablet Take 500 mg by mouth every 6 (six) hours as needed for moderate pain or fever (back pain).    Historical Provider, MD  levonorgestrel (MIRENA) 20 MCG/24HR IUD 1 each by Intrauterine route once.    Historical Provider, MD   BP 109/67  mmHg  Pulse 107  Temp(Src) 97.4 F (36.3 C) (Axillary)  Resp 18  Ht 5\' 3"  (1.6 m)  Wt 125 lb (56.7 kg)  BMI 22.15 kg/m2  SpO2 100%  LMP 06/14/2014 (Approximate)   Physical Exam  General: Well-developed, well-nourished female in no acute distress; appearance consistent with age of record HENT: normocephalic; mandible locked in open position; mucous membranes moist Eyes: pupils equal, round and reactive to light; extraocular muscles intact Neck: supple Heart: regular rate and rhythm; tachycardia Lungs: clear to auscultation bilaterally Abdomen: soft; nondistended; nontender; no masses or hepatosplenomegaly; bowel sounds present Extremities: No deformity; full range of motion; pulses normal Neurologic: Awake, alert; motor function intact in all extremities and symmetric; no facial droop Skin: Warm and dry Psychiatric: Anxious   ED Course  Procedures (including critical care time)  CLOSED REDUCTION After informed verbal consent was obtained the patient was placed on the cardiac monitor and pulse ox oximeter. She was given 5 milligrams of Valium IV. When adequate relaxation was obtained the patient's mandible was reduced with downward pressure on the molars. Both sides of the mandible locked and had to be reduced. The patient tolerated this well and there were no immediate complications.  MDM  4:44 AM Mandible remains reduced, patient resting comfortably. No emesis in ED.    Paula LibraJohn Braelin Brosch, MD 06/28/14 [phone]

## 2014-06-28 NOTE — ED Notes (Signed)
Dr. Rolley SimsMolphus at bedside and relocated lower jaw. Pt tolerated well.

## 2014-06-28 NOTE — ED Notes (Signed)
In room to give d/c instructions and rx and pt was gone. Called and left message to make pt aware to return to get prescriptions.

## 2015-02-01 ENCOUNTER — Emergency Department (HOSPITAL_COMMUNITY)
Admission: EM | Admit: 2015-02-01 | Discharge: 2015-02-01 | Disposition: A | Discharge status: Home / Self Care | Payer: BLUE CROSS/BLUE SHIELD | Attending: Emergency Medicine | Admitting: Emergency Medicine

## 2015-02-01 ENCOUNTER — Encounter (HOSPITAL_COMMUNITY): Payer: Self-pay | Admitting: Emergency Medicine

## 2015-02-01 DIAGNOSIS — G43809 Other migraine, not intractable, without status migrainosus: Secondary | ICD-10-CM | POA: Diagnosis not present

## 2015-02-01 DIAGNOSIS — Z720 Tobacco use: Secondary | ICD-10-CM | POA: Diagnosis not present

## 2015-02-01 DIAGNOSIS — Z793 Long term (current) use of hormonal contraceptives: Secondary | ICD-10-CM | POA: Diagnosis not present

## 2015-02-01 DIAGNOSIS — R110 Nausea: Secondary | ICD-10-CM | POA: Diagnosis present

## 2015-02-01 MED ORDER — KETOROLAC TROMETHAMINE 60 MG/2ML IM SOLN
60.0000 mg | Freq: Once | INTRAMUSCULAR | Status: AC
Start: 2015-02-01 — End: 2015-02-01
  Administered 2015-02-01: 60 mg via INTRAMUSCULAR
  Filled 2015-02-01: qty 2

## 2015-02-01 MED ORDER — SUMATRIPTAN SUCCINATE 100 MG PO TABS: 100.0000 mg | ORAL_TABLET | ORAL | Status: DC | PRN

## 2015-02-01 MED ORDER — DIPHENHYDRAMINE HCL 50 MG/ML IJ SOLN
25.0000 mg | Freq: Once | INTRAMUSCULAR | Status: AC
  Administered 2015-02-01: 25 mg via INTRAMUSCULAR
  Filled 2015-02-01: qty 1

## 2015-02-01 MED ORDER — PROCHLORPERAZINE EDISYLATE 5 MG/ML IJ SOLN
10.0000 mg | Freq: Once | INTRAMUSCULAR | Status: AC
  Administered 2015-02-01: 10 mg via INTRAMUSCULAR
  Filled 2015-02-01: qty 2

## 2015-02-01 MED ORDER — PROMETHAZINE HCL 25 MG PO TABS: 25.0000 mg | ORAL_TABLET | Freq: Four times a day (QID) | ORAL | Status: DC | PRN

## 2015-02-01 NOTE — Discharge Instructions (Signed)

## 2015-02-01 NOTE — ED Provider Notes (Signed)
CSN: [id number]     Arrival date & time 02/01/15  0732 History   First MD Initiated Contact with Patient 02/01/15 [phone]     Chief Complaint  Patient presents with  . Migraine     (Consider location/radiation/quality/duration/timing/severity/associated sxs/prior Treatment) HPI Comments: Patient presents to the ER for evaluation of headache. Patient reports a history of migraine headaches. She does not have a neurologist. Patient reports an increase in frequency and severity of her headaches recently. She is now having takes 2 or 3 times a week. Sometimes she uses Excedrin, sometimes are as her mother's Imitrex. She does report resolution with treatment, but is concerned about the frequency. Patient reports waking up this morning with severe global headache. She has had nausea and retching. She also reports numbness on the right side of her face and right hand which frequently accompanies her headaches.  Patient is a 24 y.o. female presenting with migraines.  Migraine Associated symptoms include headaches.    Past Medical History  Diagnosis Date  . JXBJYNWG(956.2Headache(784.0)    Past Surgical History  Procedure Laterality Date  . No past surgeries     Family History  Problem Relation Age of Onset  . Anesthesia problems Neg Hx   . Hypertension Father   . Heart disease Maternal Grandmother   . Hypertension Maternal Grandfather    Social History  Substance Use Topics  . Smoking status: Current Some Day Smoker    Types: Cigarettes  . Smokeless tobacco: Never Used  . Alcohol Use: Yes   OB History    Gravida Para Term Preterm AB TAB SAB Ectopic Multiple Living   2 1 1  0 0 0 0 0 0 1     Review of Systems  Neurological: Positive for numbness and headaches.  All other systems reviewed and are negative.     Allergies  Review of patient's allergies indicates no known allergies.  Home Medications   Prior to Admission medications   Medication Sig Start Date End Date Taking? Authorizing  Provider  acetaminophen (TYLENOL) 500 MG tablet Take 500 mg by mouth every 6 (six) hours as needed for moderate pain or fever (back pain).    Historical Provider, MD  diazepam (VALIUM) 5 MG tablet Take 1 tablet (5 mg total) by mouth every 8 (eight) hours as needed (jaw spasms). 06/28/14   Paula LibraJohn Molpus, MD  levonorgestrel (MIRENA) 20 MCG/24HR IUD 1 each by Intrauterine route once.    Historical Provider, MD  ondansetron (ZOFRAN ODT) 8 MG disintegrating tablet Take 1 tablet (8 mg total) by mouth every 8 (eight) hours as needed for nausea or vomiting. 8mg  ODT q4 hours prn nausea 06/28/14   John Molpus, MD   BP 142/64 mmHg  Pulse 71  Temp(Src) 98.3 F (36.8 C) (Oral)  Resp 20  SpO2 98% Physical Exam  Constitutional: She is oriented to person, place, and time. She appears well-developed and well-nourished. No distress.  HENT:  Head: Normocephalic and atraumatic.  Right Ear: Hearing normal.  Left Ear: Hearing normal.  Nose: Nose normal.  Mouth/Throat: Oropharynx is clear and moist and mucous membranes are normal.  Eyes: Conjunctivae and EOM are normal. Pupils are equal, round, and reactive to light.  Neck: Normal range of motion. Neck supple.  Cardiovascular: Regular rhythm, S1 normal and S2 normal.  Exam reveals no gallop and no friction rub.   No murmur heard. Pulmonary/Chest: Effort normal and breath sounds normal. No respiratory distress. She exhibits no tenderness.  Abdominal: Soft. Normal appearance and  bowel sounds are normal. There is no hepatosplenomegaly. There is no tenderness. There is no rebound, no guarding, no tenderness at McBurney's point and negative Murphy's sign. No hernia.  Musculoskeletal: Normal range of motion.  Neurological: She is alert and oriented to person, place, and time. She has normal strength. No cranial nerve deficit or sensory deficit. Coordination normal. GCS eye subscore is 4. GCS verbal subscore is 5. GCS motor subscore is 6.  Extraocular muscle movement:  normal No visual field cut Pupils: equal and reactive both direct and consensual response is normal No nystagmus present    Sensory function is intact to light touch, pinprick Proprioception intact  Grip strength 5/5 symmetric in upper extremities No pronator drift Normal finger to nose bilaterally  Lower extremity strength 5/5 against gravity Normal heel to shin bilaterally  Gait: normal   Skin: Skin is warm, dry and intact. No rash noted. No cyanosis.  Psychiatric: She has a normal mood and affect. Her speech is normal and behavior is normal. Thought content normal.  Nursing note and vitals reviewed.   ED Course  Procedures (including critical care time) Labs Review Labs Reviewed - No data to display  Imaging Review No results found. I have personally reviewed and evaluated these images and lab results as part of my medical decision-making.   EKG Interpretation None      MDM   Final diagnoses:  None   complex migraine  Patient presents to the ER for evaluation of headache. Patient has a history of frequent headaches and migraines. Symptoms today are consistent with her typical headaches. She does have neurologic features including numbness of the right side of her face and right arm, but this is common with her migraines. She did not have any objective findings on examination.  Patient will be treated for acute complex migraine. She has had success with Imitrex in the past, will be given a prescription. She was also referred to neurology.    Gilda Crease, MD 02/01/15 [phone]

## 2015-02-01 NOTE — ED Notes (Signed)
Pt states that she woke up with a headache this am, has hx of migraines-- 2-3 a week. Has been using mother's Imitrex with relief. Does not have an rx of her own.

## 2015-03-11 ENCOUNTER — Ambulatory Visit: Payer: BLUE CROSS/BLUE SHIELD | Admitting: Neurology

## 2015-09-03 ENCOUNTER — Encounter (HOSPITAL_COMMUNITY): Payer: Self-pay | Admitting: Emergency Medicine

## 2015-09-03 ENCOUNTER — Emergency Department (HOSPITAL_COMMUNITY)
Admission: EM | Admit: 2015-09-03 | Discharge: 2015-09-03 | Disposition: A | Discharge status: Other Acute Inpatient Hospital | Payer: BLUE CROSS/BLUE SHIELD

## 2015-09-03 ENCOUNTER — Encounter (HOSPITAL_COMMUNITY): Payer: Self-pay | Admitting: Oncology

## 2015-09-03 ENCOUNTER — Emergency Department (HOSPITAL_COMMUNITY)
Admission: EM | Admit: 2015-09-03 | Discharge: 2015-09-04 | Disposition: A | Discharge status: Home / Self Care | Payer: BLUE CROSS/BLUE SHIELD | Attending: Emergency Medicine | Admitting: Emergency Medicine

## 2015-09-03 DIAGNOSIS — F17210 Nicotine dependence, cigarettes, uncomplicated: Secondary | ICD-10-CM | POA: Insufficient documentation

## 2015-09-03 DIAGNOSIS — G43009 Migraine without aura, not intractable, without status migrainosus: Secondary | ICD-10-CM

## 2015-09-03 DIAGNOSIS — G43909 Migraine, unspecified, not intractable, without status migrainosus: Secondary | ICD-10-CM | POA: Insufficient documentation

## 2015-09-03 DIAGNOSIS — Z7982 Long term (current) use of aspirin: Secondary | ICD-10-CM | POA: Insufficient documentation

## 2015-09-03 NOTE — ED Notes (Signed)
Pt c/o migraine to the right side of her head.  +nausea.  +photophobia.  Has taken Excedrin Migraine, last tablet at 1800.  Pt rates pain 8/10, sharp in nature.

## 2015-09-03 NOTE — ED Notes (Signed)
Pt here with a severe migraine since 1630 today. Pt reports emesis with HA approx 10 times today. Pt unable to keep medication down.

## 2015-09-04 LAB — CBC WITH DIFFERENTIAL/PLATELET
Basophils Absolute: 0 10*3/uL (ref 0.0–0.1)
Basophils Relative: 0 %
EOS ABS: 0 10*3/uL (ref 0.0–0.7)
EOS PCT: 0 %
HCT: 39.5 % (ref 36.0–46.0)
Hemoglobin: 13.5 g/dL (ref 12.0–15.0)
LYMPHS ABS: 1.2 10*3/uL (ref 0.7–4.0)
Lymphocytes Relative: 12 %
MCH: 29.6 pg (ref 26.0–34.0)
MCHC: 34.2 g/dL (ref 30.0–36.0)
MCV: 86.6 fL (ref 78.0–100.0)
MONO ABS: 0.5 10*3/uL (ref 0.1–1.0)
Monocytes Relative: 4 %
Neutro Abs: 8.8 10*3/uL — ABNORMAL HIGH (ref 1.7–7.7)
Neutrophils Relative %: 84 %
PLATELETS: 257 10*3/uL (ref 150–400)
RBC: 4.56 MIL/uL (ref 3.87–5.11)
RDW: 13 % (ref 11.5–15.5)
WBC: 10.5 10*3/uL (ref 4.0–10.5)

## 2015-09-04 LAB — COMPREHENSIVE METABOLIC PANEL
ALT: 27 U/L (ref 14–54)
AST: 31 U/L (ref 15–41)
Albumin: 4.1 g/dL (ref 3.5–5.0)
Alkaline Phosphatase: 25 U/L — ABNORMAL LOW (ref 38–126)
Anion gap: 9 (ref 5–15)
BUN: 12 mg/dL (ref 6–20)
CALCIUM: 9 mg/dL (ref 8.9–10.3)
CO2: 23 mmol/L (ref 22–32)
CREATININE: 0.83 mg/dL (ref 0.44–1.00)
Chloride: 106 mmol/L (ref 101–111)
GFR calc Af Amer: >60 mL/min (ref >60)
GLUCOSE: 102 mg/dL — AB (ref 65–99)
Potassium: 3.8 mmol/L (ref 3.5–5.1)
Sodium: 138 mmol/L (ref 135–145)
Total Bilirubin: 1.1 mg/dL (ref 0.3–1.2)
Total Protein: 7.3 g/dL (ref 6.5–8.1)

## 2015-09-04 LAB — I-STAT BETA HCG BLOOD, ED (MC, WL, AP ONLY): I-stat hCG, quantitative: <5 m[IU]/mL (ref <5)

## 2015-09-04 MED ORDER — SODIUM CHLORIDE 0.9 % IV BOLUS (SEPSIS)
1000.0000 mL | Freq: Once | INTRAVENOUS | Status: AC
  Administered 2015-09-04: 1000 mL via INTRAVENOUS

## 2015-09-04 MED ORDER — DIPHENHYDRAMINE HCL 50 MG/ML IJ SOLN
25.0000 mg | Freq: Once | INTRAMUSCULAR | Status: AC
  Administered 2015-09-04: 25 mg via INTRAVENOUS
  Filled 2015-09-04: qty 1

## 2015-09-04 MED ORDER — KETOROLAC TROMETHAMINE 30 MG/ML IJ SOLN
30.0000 mg | Freq: Once | INTRAMUSCULAR | Status: AC
  Administered 2015-09-04: 30 mg via INTRAVENOUS
  Filled 2015-09-04: qty 1

## 2015-09-04 MED ORDER — PROCHLORPERAZINE EDISYLATE 5 MG/ML IJ SOLN
10.0000 mg | Freq: Once | INTRAMUSCULAR | Status: AC
  Administered 2015-09-04: 10 mg via INTRAVENOUS
  Filled 2015-09-04: qty 2

## 2015-09-04 MED ORDER — SUMATRIPTAN SUCCINATE 100 MG PO TABS: 100.0000 mg | ORAL_TABLET | ORAL | Status: DC | PRN

## 2015-09-04 NOTE — ED Provider Notes (Signed)
CSN: [id number]     Arrival date & time 09/03/15  2324 History   By signing my name below, I, Suzan SlickAshley N. Elon SpannerLeger, attest that this documentation has been prepared under the direction and in the presence of Richardean Canalavid H Yao, MD.  Electronically Signed: Suzan SlickAshley N. Elon SpannerLeger, ED Scribe. 09/04/2015. 12:45 AM.   Chief Complaint  Patient presents with  . Migraine   The history is provided by the patient. No language interpreter was used.    HPI Comments: Alexa [name] is a 25 y.o. female with a PMHx of Migraines who presents to the Emergency Department complaining of constant, ongoing HA onset 4:00 PM this afternoon. Currently she rates pain 8/10. Pt states "i see stars every time I get HAs". She also reports associated photophobia and nausea. OTC Excedrin attempted prior to arrival without any improvement. No recent fever or chills. Pt states she has been off of her prescribed Migraine medications for approximately 4 months now. She states current HA feels similar to previous migraines.  PCP: No primary care provider on file.    Past Medical History  Diagnosis Date  . WUJWJXBJ(478.2Headache(784.0)    Past Surgical History  Procedure Laterality Date  . No past surgeries     Family History  Problem Relation Age of Onset  . Anesthesia problems Neg Hx   . Hypertension Father   . Heart disease Maternal Grandmother   . Hypertension Maternal Grandfather    Social History  Substance Use Topics  . Smoking status: Current Some Day Smoker    Types: Cigarettes  . Smokeless tobacco: Never Used  . Alcohol Use: Yes   OB History    Gravida Para Term Preterm AB TAB SAB Ectopic Multiple Living   2 1 1  0 0 0 0 0 0 1     Review of Systems  Constitutional: Negative for fever and chills.  Eyes: Positive for photophobia.  Gastrointestinal: Positive for nausea.  Neurological: Positive for headaches.  All other systems reviewed and are negative.     Allergies  Review of patient's allergies indicates no  known allergies.  Home Medications   Prior to Admission medications   Medication Sig Start Date End Date Taking? Authorizing Provider  aspirin-acetaminophen-caffeine (EXCEDRIN MIGRAINE) [phone] MG tablet Take 1-2 tablets by mouth every 6 (six) hours as needed for headache or migraine.    Yes Historical Provider, MD  VESTURA 3-0.02 MG tablet Take 1 tablet by mouth daily. 08/09/15  Yes Historical Provider, MD  diazepam (VALIUM) 5 MG tablet Take 1 tablet (5 mg total) by mouth every 8 (eight) hours as needed (jaw spasms). Patient not taking: Reported on 09/04/2015 06/28/14   Paula LibraJohn Molpus, MD  SUMAtriptan (IMITREX) 100 MG tablet Take 1 tablet (100 mg total) by mouth every 2 (two) hours as needed for migraine. May repeat in 2 hours if headache persists or recurs. Patient not taking: Reported on 09/04/2015 02/01/15   Gilda Creasehristopher J Pollina, MD   Triage Vitals: BP 103/74 mmHg  Pulse 56  Temp(Src) 98.2 F (36.8 C) (Oral)  Resp 18  Ht 5\' 3"  (1.6 m)  Wt 130 lb (58.968 kg)  BMI 23.03 kg/m2  SpO2 100%  LMP 08/06/2015 (Approximate)   Physical Exam  Constitutional: She is oriented to person, place, and time. She appears well-developed and well-nourished. No distress.  HENT:  Head: Normocephalic and atraumatic.  Eyes: EOM are normal.  Mild photophobia   Neck: Normal range of motion.  Cardiovascular: Normal rate, regular rhythm and normal heart sounds.  Pulmonary/Chest: Effort normal and breath sounds normal.  Abdominal: Soft. She exhibits no distension. There is no tenderness.  Musculoskeletal: Normal range of motion.  Neurological: She is alert and oriented to person, place, and time.  Cranial nerves 2-12 intact  Normal strength throughout Normal finger to nose Normal sensation Nl gait   Skin: Skin is warm and dry.  Psychiatric: She has a normal mood and affect. Judgment normal.  Nursing note and vitals reviewed.   ED Course  Procedures (including critical care time)  DIAGNOSTIC  STUDIES: Oxygen Saturation is 100% on RA, Normal by my interpretation.    COORDINATION OF CARE: 12:39 AM- Will order blood work. Will give Benadryl, Toradol, Compazine, and fluids. Discussed treatment plan with pt at bedside and pt agreed to plan.     Labs Review Labs Reviewed  CBC WITH DIFFERENTIAL/PLATELET - Abnormal; Notable for the following:    Neutro Abs 8.8 (*)    All other components within normal limits  COMPREHENSIVE METABOLIC PANEL - Abnormal; Notable for the following:    Glucose, Bld 102 (*)    Alkaline Phosphatase 25 (*)    All other components within normal limits  I-STAT BETA HCG BLOOD, ED ([facility], [facility], [facility] ONLY)    Imaging Review No results found. I have personally reviewed and evaluated these images and lab results as part of my medical decision-making.   EKG Interpretation None      MDM   Final diagnoses:  None   Alexa [name] is a 25 y.o. female here with headaches. Typical of her migraines. Nl neuro exam. Will give migraine cocktail and reassess.   2:18 AM Labs unremarkable. Felt better after migraine cocktail. Was on imitrex before and will prescribe imitrex as needed. Will have her see neurology.   I personally performed the services described in this documentation, which was scribed in my presence. The recorded information has been reviewed and is accurate.   Richardean Canal, MD 09/04/15 [phone]

## 2015-09-04 NOTE — Discharge Instructions (Signed)
Take tylenol, motrin for headaches.   Take imitrex for severe headaches.   See neurologist for follow up.   Return to ER if you have severe headaches, vomiting, fevers.

## 2017-03-05 ENCOUNTER — Encounter (HOSPITAL_COMMUNITY): Payer: Self-pay | Admitting: *Deleted

## 2017-03-05 ENCOUNTER — Ambulatory Visit (HOSPITAL_COMMUNITY)
Admission: EM | Admit: 2017-03-05 | Discharge: 2017-03-05 | Disposition: A | Discharge status: Home / Self Care | Payer: Self-pay | Attending: Family Medicine | Admitting: Family Medicine

## 2017-03-05 DIAGNOSIS — G43709 Chronic migraine without aura, not intractable, without status migrainosus: Secondary | ICD-10-CM

## 2017-03-05 MED ORDER — ONDANSETRON HCL 4 MG PO TABS: 4.0000 mg | ORAL_TABLET | Freq: Four times a day (QID) | ORAL | 0 refills | Status: DC

## 2017-03-05 MED ORDER — ONDANSETRON 4 MG PO TBDP
ORAL_TABLET | ORAL | Status: AC
  Filled 2017-03-05: qty 1

## 2017-03-05 MED ORDER — ONDANSETRON 4 MG PO TBDP
4.0000 mg | ORAL_TABLET | Freq: Once | ORAL | Status: AC
  Administered 2017-03-05: 4 mg via ORAL

## 2017-03-05 MED ORDER — METOCLOPRAMIDE HCL 5 MG/ML IJ SOLN
INTRAMUSCULAR | Status: AC
  Filled 2017-03-05: qty 2

## 2017-03-05 MED ORDER — DEXAMETHASONE SODIUM PHOSPHATE 10 MG/ML IJ SOLN
INTRAMUSCULAR | Status: AC
  Filled 2017-03-05: qty 1

## 2017-03-05 MED ORDER — METOCLOPRAMIDE HCL 5 MG/ML IJ SOLN
5.0000 mg | Freq: Once | INTRAMUSCULAR | Status: AC
Start: 2017-03-05 — End: 2017-03-05
  Administered 2017-03-05: 5 mg via INTRAMUSCULAR

## 2017-03-05 MED ORDER — KETOROLAC TROMETHAMINE 30 MG/ML IJ SOLN
INTRAMUSCULAR | Status: AC
  Filled 2017-03-05: qty 2

## 2017-03-05 MED ORDER — KETOROLAC TROMETHAMINE 60 MG/2ML IM SOLN
45.0000 mg | Freq: Once | INTRAMUSCULAR | Status: AC
  Administered 2017-03-05: 45 mg via INTRAMUSCULAR

## 2017-03-05 MED ORDER — DEXAMETHASONE SODIUM PHOSPHATE 10 MG/ML IJ SOLN
10.0000 mg | Freq: Once | INTRAMUSCULAR | Status: AC
Start: 2017-03-05 — End: 2017-03-05
  Administered 2017-03-05: 10 mg via INTRAMUSCULAR

## 2017-03-05 NOTE — ED Provider Notes (Signed)
[facility]    CSN: [id number] Arrival date & time: 03/05/17  1146     History   Chief Complaint Chief Complaint  Patient presents with  . Migraine    HPI Alexa [name] is a 26 y.o. female.   26 year old female with a history of migraine headaches since the age of 26 presents with a recurrent migraine headache started early this morning. The past 2-3 years she has been having headaches about twice a month. Now she is having some daily to 3 times. She had been prescribed Imitrex some years ago but the one she took this morning did not work. Currently she has right parietal head pain. Associated numbness to the right side of the scalp and tongue. Denies other focal paresthesias and no weakness. Denies problems with senses. She states this headache is typical of her previous headaches that are just coming more frequently. She is also requesting referral to neurology.      Past Medical History:  Diagnosis Date  . WUJWJXBJ(478.2Headache(784.0)     Patient Active Problem List   Diagnosis Date Noted  . Migraine with aura, without mention of intractable migraine without mention of status migrainosus 05/25/2012  . Family history of Down syndrome 10/25/2010  . CHEST PAIN UNSPECIFIED 12/22/2009    Past Surgical History:  Procedure Laterality Date  . NO PAST SURGERIES      OB History    Gravida Para Term Preterm AB Living   2 1 1  0 0 1   SAB TAB Ectopic Multiple Live Births   0 0 0 0 1       Home Medications    Prior to Admission medications   Medication Sig Start Date End Date Taking? Authorizing Provider  SUMAtriptan (IMITREX) 100 MG tablet Take 1 tablet (100 mg total) by mouth every 2 (two) hours as needed for migraine. May repeat in 2 hours if headache persists or recurs. 09/04/15  Yes Charlynne PanderYao, Carilyn Woolston Hsienta, MD  aspirin-acetaminophen-caffeine [facility](EXCEDRIN MIGRAINE) [phone] MG tablet Take 1-2 tablets by mouth every 6 (six) hours as needed for headache or migraine.      [provider]  diazepam (VALIUM) 5 MG tablet Take 1 tablet (5 mg total) by mouth every 8 (eight) hours as needed (jaw spasms). Patient not taking: Reported on 09/04/2015 06/28/14   Molpus, Jonny RuizJohn, MD  ondansetron (ZOFRAN) 4 MG tablet Take 1 tablet (4 mg total) by mouth every 6 (six) hours. 03/05/17   Hayden RasmussenMabe, Lyn Joens, NP  VESTURA 3-0.02 MG tablet Take 1 tablet by mouth daily. 08/09/15   [provider]    Family History Family History  Problem Relation Age of Onset  . Hypertension Father   . Heart disease Maternal Grandmother   . Hypertension Maternal Grandfather   . Anesthesia problems Neg Hx     Social History Social History   Tobacco Use  . Smoking status: Current Some Day Smoker    Types: Cigarettes  . Smokeless tobacco: Never Used  Substance Use Topics  . Alcohol use: Yes  . Drug use: No     Allergies   Patient has no known allergies.   Review of Systems Review of Systems  Constitutional: Positive for activity change. Negative for fever.  HENT: Negative.   Eyes: Positive for photophobia. Negative for visual disturbance.  Respiratory: Negative.   Cardiovascular: Negative.   Gastrointestinal: Positive for nausea and vomiting.  Genitourinary: Negative.   Musculoskeletal: Negative.   Neurological: Positive for numbness and headaches. Negative for tremors, syncope,  facial asymmetry and speech difficulty.       Denies problems with vision, speech, hearing, swallowing, ambulation or other movement. No problems with memory loss, confusion or orientation.  Psychiatric/Behavioral: Negative.   All other systems reviewed and are negative.    Physical Exam Triage Vital Signs ED Triage Vitals  Enc Vitals Group     BP 03/05/17 1228 101/64     Pulse Rate 03/05/17 1228 (!) 56     Resp --      Temp 03/05/17 1228 98.3 F (36.8 C)     Temp Source 03/05/17 1228 Oral     SpO2 03/05/17 1228 100 %     Weight --      Height --      Head Circumference --      Peak  Flow --      Pain Score 03/05/17 1226 5     Pain Loc --      Pain Edu? --      Excl. in GC? --    No data found.  Updated Vital Signs BP 101/64 (BP Location: Left Arm)   Pulse (!) 56   Temp 98.3 F (36.8 C) (Oral)   LMP 02/17/2017 (Approximate)   SpO2 100%   Visual Acuity Right Eye Distance:   Left Eye Distance:   Bilateral Distance:    Right Eye Near:   Left Eye Near:    Bilateral Near:     Physical Exam  Constitutional: She is oriented to person, place, and time. She appears well-developed and well-nourished. No distress.  HENT:  Head: Normocephalic and atraumatic.  Right Ear: External ear normal.  Left Ear: External ear normal.  Mouth/Throat: Oropharynx is clear and moist. No oropharyngeal exudate.  Eyes: Conjunctivae and EOM are normal. Pupils are equal, round, and reactive to light. Right eye exhibits no discharge. Left eye exhibits no discharge.  Neck: Normal range of motion. Neck supple.  Cardiovascular: Normal rate, regular rhythm, normal heart sounds and intact distal pulses.  Pulmonary/Chest: Effort normal and breath sounds normal. No respiratory distress.  Abdominal: Soft. There is no tenderness.  Musculoskeletal: Normal range of motion. She exhibits no edema or tenderness.  Lymphadenopathy:    She has no cervical adenopathy.  Neurological: She is alert and oriented to person, place, and time. She has normal strength. She displays no tremor. No cranial nerve deficit or sensory deficit. She exhibits normal muscle tone. Coordination and gait normal. GCS eye subscore is 4. GCS verbal subscore is 5. GCS motor subscore is 6.  Skin: Skin is warm and dry. No rash noted.  Psychiatric: She has a normal mood and affect.  Nursing note and vitals reviewed.    UC Treatments / Results  Labs (all labs ordered are listed, but only abnormal results are displayed) Labs Reviewed - No data to display  EKG  EKG Interpretation None       Radiology No results  found.  Procedures Procedures (including critical care time)  Medications Ordered in UC Medications  ketorolac (TORADOL) injection 45 mg (not administered)  metoCLOPramide (REGLAN) injection 5 mg (not administered)  dexamethasone (DECADRON) injection 10 mg (not administered)  ondansetron (ZOFRAN-ODT) disintegrating tablet 4 mg (not administered)     Initial Impression / Assessment and Plan / UC Course  I have reviewed the triage vital signs and the nursing notes.  Pertinent labs & imaging results that were available during my care of the patient were reviewed by me and considered in my medical decision making (  see chart for details).       Final Clinical Impressions(s) / UC Diagnoses   Final diagnoses:  Chronic migraine without aura without status migrainosus, not intractable    ED Discharge Orders        Ordered    ondansetron (ZOFRAN) 4 MG tablet  Every 6 hours     03/05/17 1259       Controlled Substance Prescriptions [place] Controlled Substance Registry consulted? Not Applicable   Hayden Rasmussen, NP 03/05/17 1301

## 2017-03-05 NOTE — ED Triage Notes (Signed)
Per pt she is having more frequent migraines. Per pt she has hx of Migraines, per pt she took Imitrex today at 7 am, per pt she gets nauseous when ever she gets her migraines.

## 2017-08-21 ENCOUNTER — Encounter (HOSPITAL_COMMUNITY): Payer: Self-pay | Admitting: Emergency Medicine

## 2017-08-21 ENCOUNTER — Ambulatory Visit (HOSPITAL_COMMUNITY)
Admission: EM | Admit: 2017-08-21 | Discharge: 2017-08-21 | Disposition: A | Discharge status: Home / Self Care | Payer: 59 | Attending: Family Medicine | Admitting: Family Medicine

## 2017-08-21 DIAGNOSIS — Z3A01 Less than 8 weeks gestation of pregnancy: Secondary | ICD-10-CM

## 2017-08-21 DIAGNOSIS — Z3201 Encounter for pregnancy test, result positive: Secondary | ICD-10-CM | POA: Diagnosis not present

## 2017-08-21 DIAGNOSIS — N926 Irregular menstruation, unspecified: Secondary | ICD-10-CM | POA: Diagnosis not present

## 2017-08-21 LAB — POCT PREGNANCY, URINE: Preg Test, Ur: POSITIVE — AB

## 2017-08-21 NOTE — ED Triage Notes (Signed)
Pt here for pregnancy test. She had a positive at home test.

## 2017-08-21 NOTE — ED Provider Notes (Signed)
[facility]    CSN: [id number] Arrival date & time: 08/21/17  1126     History   Chief Complaint Chief Complaint  Patient presents with  . Possible Pregnancy    HPI Alexa [name] is a 27 y.o. female.   Alexa [name] presents with request for a pregnancy test in order to be seen with her OB. She has had 4 positive home tests over the past few days. Missed her period. LMP she feels was 4/12. She is sexually active and not on birth control. Has 1 living child and has had 1 pregnancy which was terminated. Denies vaginal bleeding or pelvic pain. Intermittent cramping and nausea. No vomiting. Has been taking prenatal vitamins. No other medications regularly. Has an ob she has seen in the past she plans to follow with.     ROS per HPI.      Past Medical History:  Diagnosis Date  . WUJWJXBJ(478.2)     Patient Active Problem List   Diagnosis Date Noted  . Migraine with aura, without mention of intractable migraine without mention of status migrainosus 05/25/2012  . Family history of Down syndrome 10/25/2010  . CHEST PAIN UNSPECIFIED 12/22/2009    Past Surgical History:  Procedure Laterality Date  . NO PAST SURGERIES      OB History    Gravida  2   Para  1   Term  1   Preterm  0   AB  0   Living  1     SAB  0   TAB  0   Ectopic  0   Multiple  0   Live Births  1            Home Medications    Prior to Admission medications   Medication Sig Start Date End Date Taking? Authorizing Provider  aspirin-acetaminophen-caffeine (EXCEDRIN MIGRAINE) [phone] MG tablet Take 1-2 tablets by mouth every 6 (six) hours as needed for headache or migraine.     [provider]  diazepam (VALIUM) 5 MG tablet Take 1 tablet (5 mg total) by mouth every 8 (eight) hours as needed (jaw spasms). Patient not taking: Reported on 09/04/2015 06/28/14   Molpus, Jonny Ruiz, MD  ondansetron (ZOFRAN) 4 MG tablet Take 1 tablet (4 mg total) by mouth every 6 (six)  hours. 03/05/17   Hayden Rasmussen, NP  SUMAtriptan (IMITREX) 100 MG tablet Take 1 tablet (100 mg total) by mouth every 2 (two) hours as needed for migraine. May repeat in 2 hours if headache persists or recurs. 09/04/15   Charlynne Pander, MD  VESTURA 3-0.02 MG tablet Take 1 tablet by mouth daily. 08/09/15   [provider]    Family History Family History  Problem Relation Age of Onset  . Hypertension Father   . Heart disease Maternal Grandmother   . Hypertension Maternal Grandfather   . Anesthesia problems Neg Hx     Social History Social History   Tobacco Use  . Smoking status: Current Some Day Smoker    Types: Cigarettes  . Smokeless tobacco: Never Used  Substance Use Topics  . Alcohol use: Yes  . Drug use: No     Allergies   Patient has no known allergies.   Review of Systems Review of Systems   Physical Exam Triage Vital Signs ED Triage Vitals  Enc Vitals Group     BP 08/21/17 1206 125/75     Pulse Rate 08/21/17 1206 75     Resp 08/21/17 1206 18  Temp 08/21/17 1206 98.1 F (36.7 C)     Temp src --      SpO2 08/21/17 1206 100 %     Weight --      Height --      Head Circumference --      Peak Flow --      Pain Score 08/21/17 1154 0     Pain Loc --      Pain Edu? --      Excl. in GC? --    No data found.  Updated Vital Signs BP 125/75   Pulse 75   Temp 98.1 F (36.7 C)   Resp 18   LMP 07/13/2017   SpO2 100%    Physical Exam  Constitutional: She is oriented to person, place, and time. She appears well-developed and well-nourished. No distress.  Cardiovascular: Normal rate, regular rhythm and normal heart sounds.  Pulmonary/Chest: Effort normal and breath sounds normal.  Abdominal: There is no tenderness.  Neurological: She is alert and oriented to person, place, and time.  Skin: Skin is warm and dry.     UC Treatments / Results  Labs (all labs ordered are listed, but only abnormal results are displayed) Labs Reviewed  POCT  PREGNANCY, URINE - Abnormal; Notable for the following components:      Result Value   Preg Test, Ur POSITIVE (*)    All other components within normal limits    EKG None  Radiology No results found.  Procedures Procedures (including critical care time)  Medications Ordered in UC Medications - No data to display  Initial Impression / Assessment and Plan / UC Course  I have reviewed the triage vital signs and the nursing notes.  Pertinent labs & imaging results that were available during my care of the patient were reviewed by me and considered in my medical decision making (see chart for details).     Positive urine pregnancy today in clinic. Encouraged to continue with daily prenatal vitamin. Approximately [redacted]w[redacted]d based on LMP. To follow up with OB. Return precautions provided. Patient verbalized understanding and agreeable to plan.    Final Clinical Impressions(s) / UC Diagnoses   Final diagnoses:  Pregnancy test positive  Less than [redacted] weeks gestation of pregnancy     Discharge Instructions     Your urine pregnancy test is positive today, 5/21. Based on your last menstrual period on 4/12 you are approximately 5weeks 4 days pregnant, estimated due date of 04/19/2018.   Please continue with daily prenatal vitamin. See provided list of medications safe during pregnancy. Please continue to follow with your OB for your first appointment. [facility] develop [facility]    ED Prescriptions    None     Controlled Substance Prescriptions [place] Controlled Substance Registry consulted? Not Applicable   Georgetta Haber, NP 08/21/17 1227

## 2017-08-21 NOTE — Discharge Instructions (Signed)
Your urine pregnancy test is positive today, 5/21. Based on your last menstrual period on 4/12 you are approximately 5weeks 4 days pregnant, estimated due date of 04/19/2018.   Please continue with daily prenatal vitamin. See provided list of medications safe during pregnancy. Please continue to follow with your OB for your first appointment. [facility] develop [facility] or [facility]

## 2017-09-18 ENCOUNTER — Other Ambulatory Visit: Payer: Self-pay

## 2017-09-18 ENCOUNTER — Inpatient Hospital Stay (HOSPITAL_COMMUNITY)
Admission: AD | Admit: 2017-09-18 | Discharge: 2017-09-18 | Disposition: A | Discharge status: Home / Self Care | Payer: 59 | Source: Ambulatory Visit | Attending: Obstetrics and Gynecology | Admitting: Obstetrics and Gynecology

## 2017-09-18 ENCOUNTER — Encounter (HOSPITAL_COMMUNITY): Payer: Self-pay | Admitting: *Deleted

## 2017-09-18 ENCOUNTER — Inpatient Hospital Stay (HOSPITAL_COMMUNITY): Payer: 59

## 2017-09-18 DIAGNOSIS — Z87891 Personal history of nicotine dependence: Secondary | ICD-10-CM | POA: Insufficient documentation

## 2017-09-18 DIAGNOSIS — Z3A09 9 weeks gestation of pregnancy: Secondary | ICD-10-CM | POA: Diagnosis not present

## 2017-09-18 DIAGNOSIS — O020 Blighted ovum and nonhydatidiform mole: Secondary | ICD-10-CM | POA: Diagnosis not present

## 2017-09-18 DIAGNOSIS — R109 Unspecified abdominal pain: Secondary | ICD-10-CM | POA: Diagnosis present

## 2017-09-18 DIAGNOSIS — O26899 Other specified pregnancy related conditions, unspecified trimester: Secondary | ICD-10-CM

## 2017-09-18 DIAGNOSIS — N939 Abnormal uterine and vaginal bleeding, unspecified: Secondary | ICD-10-CM | POA: Diagnosis present

## 2017-09-18 HISTORY — DX: Other specified health status: Z78.9

## 2017-09-18 LAB — URINALYSIS, ROUTINE W REFLEX MICROSCOPIC
Bilirubin Urine: NEGATIVE
Glucose, UA: NEGATIVE mg/dL
HGB URINE DIPSTICK: NEGATIVE
Ketones, ur: NEGATIVE mg/dL
Leukocytes, UA: NEGATIVE
Nitrite: NEGATIVE
Protein, ur: NEGATIVE mg/dL
Specific Gravity, Urine: 1.024 (ref 1.005–1.030)
pH: 8 (ref 5.0–8.0)

## 2017-09-18 MED ORDER — PROMETHAZINE HCL 12.5 MG PO TABS: 12.5000 mg | ORAL_TABLET | Freq: Four times a day (QID) | ORAL | 0 refills | Status: DC | PRN

## 2017-09-18 MED ORDER — IBUPROFEN 600 MG PO TABS: 600.0000 mg | ORAL_TABLET | Freq: Four times a day (QID) | ORAL | 0 refills | Status: DC | PRN

## 2017-09-18 MED ORDER — OXYCODONE-ACETAMINOPHEN 5-325 MG PO TABS: 1.0000 | ORAL_TABLET | ORAL | 0 refills | Status: DC | PRN

## 2017-09-18 MED ORDER — MISOPROSTOL 200 MCG PO TABS: 800.0000 ug | ORAL_TABLET | Freq: Four times a day (QID) | ORAL | 0 refills | Status: DC

## 2017-09-18 NOTE — MAU Note (Signed)
Pt reports cramping off/on and spotting since yesterday.

## 2017-09-18 NOTE — Discharge Instructions (Signed)
Blighted Ovum  A blighted ovum is a common kind of early pregnancy failure. It happens when a fertilized egg attaches to the uterus but stops growing. Even though the egg never develops, the body acts like it is pregnant. A sac starts to form around the egg, and tissue to support a baby starts to form in the placenta.  What are the causes?  This condition is usually caused by a genetic defect in the egg.  What are the signs or symptoms?  Early symptoms of this condition are the same as those of early pregnancy. They include:   A missed menstrual period.   Fatigue.   Feeling sick to your stomach (nauseous).   Sore breasts.    Later symptoms are those of pregnancy loss. They include:   Abdominal cramps.   Vaginal bleeding or spotting.   A menstrual period that is heavier than usual.    How is this diagnosed?  This condition is usually diagnosed during a routine ultrasound. It can be confirmed with blood tests.  How is this treated?  This condition may be treated by:   Waiting until your body naturally gets rid of the empty egg sac and placenta (miscarriage).   Taking medicine to start a miscarriage. This medicine can be taken by mouth or placed into the vagina.   Having a surgical procedure to remove the tissue. Your health care provider would open the entrance to your womb (dilation) and remove the tissue (curettage).    Follow these instructions at home:   Take over-the-counter and prescription medicines only as told by your health care provider.   Talk to your health care provider about when you can try to get pregnant again. Having this condition does not mean you will lose future pregnancies.   After your miscarriage:  ? Rest at home for a few days.  ? You may bleed heavily for a week or more, and you may have light bleeding for a couple weeks after that. Wear a pad until vaginal bleeding stops.  Contact a health care provider if:   You have a fever or chills.   Your pain medicine is not  helping.   You have vaginal bleeding that continues for longer than expected.  Get help right away if:   You have severe abdominal pain.   You feel dizzy or faint.   You pass out.   You have very heavy vaginal bleeding. A sign that vaginal bleeding is very heavy is if blood soaks through two large sanitary pads an hour for more than two hours.  This information is not intended to replace advice given to you by your health care provider. Make sure you discuss any questions you have with your health care provider.  Document Released: 07/05/2010 Document Revised: 08/26/2015 Document Reviewed: 08/05/2014  [employer]vier Interactive Patient Education  2018 [employer]

## 2017-09-18 NOTE — MAU Provider Note (Signed)
History     CSN: [id number]  Arrival date and time: 09/18/17 0944   First Provider Initiated Contact with Patient 09/18/17 1020      Chief Complaint  Patient presents with  . Abdominal Pain  . Vaginal Bleeding   HPI Ms. Rusty AusJuleisy Gonzalez-Ferreras is a 27 y.o. G4P1011 at 8125w4d who presents to MAU today with complaint of spotting and abdominal cramping. She states cramping is mild and she has not taken anything for pain. She states spotting is minimal and started yesterday. She has had N/V with up to 5 episodes of emesis yesterday, but states that she also had a migraine. She denies UTI symptoms or fever. She should be ~ 9-[redacted] weeks GA by LMP and had an IUGS without YS or FP in the office last week. This was considered suspicious for a blighted ovum, but the patient wanted hCG results to confirm. HCG was essentially unchanged after 48 hours. The patient is seeking a second US to confirm.    OB History    Gravida  4   Para  1   Term  1   Preterm  0   AB  1   Living  1     SAB  0   TAB  1   Ectopic  0   Multiple  0   Live Births  1           Past Medical History:  Diagnosis Date  . Headache(784.0)   . Medical history non-contributory     Past Surgical History:  Procedure Laterality Date  . NO PAST SURGERIES      Family History  Problem Relation Age of Onset  . Hypertension Father   . Heart disease Maternal Grandmother   . Hypertension Maternal Grandfather   . Anesthesia problems Neg Hx     Social History   Tobacco Use  . Smoking status: Former Smoker    Types: Cigarettes  . Smokeless tobacco: Never Used  . Tobacco comment: quit about 4-5 weeks ago   Substance Use Topics  . Alcohol use: Not Currently  . Drug use: No    Allergies: No Known Allergies  No medications prior to admission.    Review of Systems  Constitutional: Negative for fever.  Gastrointestinal: Positive for abdominal pain, nausea and vomiting. Negative for constipation and  diarrhea.  Genitourinary: Positive for vaginal bleeding and vaginal discharge. Negative for dysuria, frequency and urgency.   Physical Exam   Blood pressure 97/61, pulse 70, temperature 98.3 F (36.8 C), temperature source Oral, resp. rate 18, height 5\' 3"  (1.6 m), weight 118 lb (53.5 kg), last menstrual period 07/13/2017, SpO2 100 %.  Physical Exam  Nursing note and vitals reviewed. Constitutional: She is oriented to person, place, and time. She appears well-developed and well-nourished. No distress.  HENT:  Head: Normocephalic and atraumatic.  Cardiovascular: Normal rate.  Respiratory: Effort normal.  GI: Soft. She exhibits no distension and no mass. There is no tenderness. There is no rebound and no guarding.  Genitourinary: Uterus is not enlarged and not tender. Cervix exhibits no motion tenderness, no discharge and no friability. Right adnexum displays no mass and no tenderness. Left adnexum displays no mass and no tenderness. No bleeding in the vagina. Vaginal discharge (small thick, white discharge noted) found.  Neurological: She is alert and oriented to person, place, and time.  Skin: Skin is warm and dry. No erythema.  Psychiatric: She has a normal mood and affect.  Cervix: closed, thick  Results for orders placed or performed during the hospital encounter of 09/18/17 (from the past 24 hour(s))  Urinalysis, Routine w reflex microscopic     Status: None   Collection Time: 09/18/17 10:10 AM  Result Value Ref Range   Color, Urine YELLOW YELLOW   APPearance CLEAR CLEAR   Specific Gravity, Urine 1.024 1.005 - 1.030   pH 8.0 5.0 - 8.0   Glucose, UA NEGATIVE NEGATIVE mg/dL   Hgb urine dipstick NEGATIVE NEGATIVE   Bilirubin Urine NEGATIVE NEGATIVE   Ketones, ur NEGATIVE NEGATIVE mg/dL   Protein, ur NEGATIVE NEGATIVE mg/dL   Nitrite NEGATIVE NEGATIVE   Leukocytes, UA NEGATIVE NEGATIVE   US Ob Comp Less 14 Wks  Result Date: 09/18/2017 CLINICAL DATA:  Cramping affecting  pregnancy, uncertain LMP, bleeding and cramping since Tuesday EXAM: OBSTETRIC <14 WK [place] AND TRANSVAGINAL OB US TECHNIQUE: Both transabdominal and transvaginal ultrasound examinations were performed for complete evaluation of the gestation as well as the maternal uterus, adnexal regions, and pelvic cul-de-sac. Transvaginal technique was performed to assess early pregnancy. COMPARISON:  None for this gestation FINDINGS: Intrauterine gestational sac: Present Yolk sac:  Absent Embryo:  Absent Cardiac Activity: N/A Heart Rate: N/A  bpm MSD: 20.7 mm   6 w   6 d Subchorionic hemorrhage:  Small subchronic hemorrhage Maternal uterus/adnexae: RIGHT ovary normal size and morphology, 1.5 x 2.5 x 2.6 cm. LEFT ovary normal size and morphology, 2.1 x 3.6 x 2.0 cm, containing a small corpus luteum. Trace free pelvic fluid. No adnexal masses. IMPRESSION: Gestational sac identified within the uterus without visualization of a yolk sac or fetal pole. Small subchronic hemorrhage. Findings are suspicious but not yet definitive for failed pregnancy. Recommend follow-up US in 10-14 days for definitive diagnosis. This recommendation follows SRU consensus guidelines: Diagnostic Criteria for Nonviable Pregnancy Early in the First Trimester. Malva Limes Med 2013; 409:8119-14. Electronically Signed   By: Ulyses Southward M.D.   On: 09/18/2017 12:29   US Ob Transvaginal  Result Date: 09/18/2017 CLINICAL DATA:  Cramping affecting pregnancy, uncertain LMP, bleeding and cramping since Tuesday EXAM: OBSTETRIC <14 WK [place] AND TRANSVAGINAL OB US TECHNIQUE: Both transabdominal and transvaginal ultrasound examinations were performed for complete evaluation of the gestation as well as the maternal uterus, adnexal regions, and pelvic cul-de-sac. Transvaginal technique was performed to assess early pregnancy. COMPARISON:  None for this gestation FINDINGS: Intrauterine gestational sac: Present Yolk sac:  Absent Embryo:  Absent Cardiac Activity: N/A Heart Rate:  N/A  bpm MSD: 20.7 mm   6 w   6 d Subchorionic hemorrhage:  Small subchronic hemorrhage Maternal uterus/adnexae: RIGHT ovary normal size and morphology, 1.5 x 2.5 x 2.6 cm. LEFT ovary normal size and morphology, 2.1 x 3.6 x 2.0 cm, containing a small corpus luteum. Trace free pelvic fluid. No adnexal masses. IMPRESSION: Gestational sac identified within the uterus without visualization of a yolk sac or fetal pole. Small subchronic hemorrhage. Findings are suspicious but not yet definitive for failed pregnancy. Recommend follow-up US in 10-14 days for definitive diagnosis. This recommendation follows SRU consensus guidelines: Diagnostic Criteria for Nonviable Pregnancy Early in the First Trimester. Malva Limes Med 2013; 782:9562-13. Electronically Signed   By: Ulyses Southward M.D.   On: 09/18/2017 12:29    MAU Course  Procedures None  MDM Discussed patient with Dr. Senaida Ores. Reviewed office chart. Quant hCG in the office was 69,591 on 6/13 and 67,704 on 6/17. US showed IUGS without YS or FP on 6/13 - Probable  blighted ovum. Office has tried to reach her to discuss options for Cytotec vs. D&C but states patient has not returned calls.  Discussed these results with patient. She would like [place] to confirm diagnosis prior to deciding on management. [place] today shows IUGS only without YS or FP at [redacted]w[redacted]d, consistent with likely blighted ovum.  Patient received a call from the office while waiting for results and has D&C scheduled for Monday with Dr. Mindi Slicker. She would like to know if there is an option for a sooner appointment. Per Dr. Senaida Ores, patient may call the office to try to move up appointment. Patient would also like Cytotec Rx in case she decides to try that prior to the surgery. Dr. Senaida Ores agrees with plan.   Early Intrauterine Pregnancy Failure  _x__  Documented intrauterine pregnancy failure less than or equal to [redacted] weeks gestation  _x__  No serious current illness  __x_  Baseline Hgb greater than or  equal to 10g/dl  __x_  Patient has easily accessible transportation to the hospital  _x__  Clear preference  _x__  Practitioner/physician deems patient reliable  _x__  Counseling by practitioner or physician  _N/A__  Patient education by RN  _N.A__  Consent form signed  N/A___  Rho-Gam given by RN if indicated  _x__ Medication dispensed   __x_   Cytotec 800 mcg  _x_   Intravaginally by patient at home         __   Intravaginally by RN in MAU        __   Rectally by patient at home        __   Rectally by RN in MAU  _x__  Ibuprofen 600 mg 1 tablet by mouth every 6 hours as needed #30  __x_  Oxycodone/acetaminophen 5/325 mg by mouth every 4 to 6 hours as needed  __x_  Phenergan 12.5 mg by mouth every 4 hours as needed for nausea   Assessment and Plan  A: Blighted ovum   P: Discharge home Rx for Cytotec, Phenergan, Ibuprofen and Percocet given  Bleeding precautions discussed Patient advised to follow-up with [facility] as planned and may call the office for a sooner surgery appointment if available.  Patient may return to MAU as needed or if her condition were to change or worsen  Vonzella Nipple, PA-C 09/18/2017, 1:47 PM

## 2017-09-20 NOTE — H&P (Signed)
Alexa [name] is an [age] y.Z.O1W9604o.G3P1021 female with blighted ovum. Pt presented to the office to begin pregnancy care but no iup noted. Her quant stayed essentially the same over 4 days - 69000 to 67000. She was offered medical management versus surgical management and opts for the latter. Risks/benefits discussed.   Pertinent Gynecological History: Bleeding: none Contraception: none DES exposure: denies Blood transfusions: none Sexually transmitted diseases: past history: trichomonas Previous GYN Procedures: n/a  Last pap: normal Date: 11/2016 OB History: G3, P1021   Menstrual History: Menarche age: [age] Patient's last menstrual period was 07/13/2017.    Past Medical History:  Diagnosis Date  . Headache(784.0)   . Medical history non-contributory     Past Surgical History:  Procedure Laterality Date  . NO PAST SURGERIES      Family History  Problem Relation Age of Onset  . Hypertension Father   . Heart disease Maternal Grandmother   . Hypertension Maternal Grandfather   . Anesthesia problems Neg Hx     Social History:  reports that she has quit smoking. Her smoking use included cigarettes. She has never used smokeless tobacco. She reports that she drank alcohol. She reports that she does not use drugs.  Allergies: No Known Allergies  No medications prior to admission.    Review of Systems  Constitutional: Negative for chills, fever, malaise/fatigue and weight loss.  Eyes: Negative for blurred vision and double vision.  Respiratory: Negative for shortness of breath.   Cardiovascular: Negative for chest pain and leg swelling.  Gastrointestinal: Negative for abdominal pain, heartburn, nausea and vomiting.  Genitourinary: Negative for dysuria.  Musculoskeletal: Negative for myalgias.  Skin: Negative for rash.  Neurological: Negative for dizziness and headaches.  Endo/Heme/Allergies: Does not bruise/bleed easily.  Psychiatric/Behavioral: Negative for depression,  hallucinations, substance abuse and suicidal ideas. The patient is nervous/anxious.     Last menstrual period 07/13/2017. Physical Exam  Constitutional: She is oriented to person, place, and time. She appears well-developed and well-nourished.  Eyes: Pupils are equal, round, and reactive to light.  Neck: Normal range of motion.  Cardiovascular: Normal rate.  Respiratory: Effort normal.  GI: Soft.  Genitourinary: Vagina normal and uterus normal.  Musculoskeletal: Normal range of motion.  Neurological: She is alert and oriented to person, place, and time.  Skin: Skin is warm.  Psychiatric: She has a normal mood and affect. Her behavior is normal. Judgment and thought content normal.    No results found for this or any previous visit (from the past 24 hour(s)).  [place] Ob Comp Less 14 Wks  Result Date: 09/18/2017 CLINICAL DATA:  Cramping affecting pregnancy, uncertain LMP, bleeding and cramping since Tuesday EXAM: OBSTETRIC <14 WK US AND TRANSVAGINAL OB US TECHNIQUE: Both transabdominal and transvaginal ultrasound examinations were performed for complete evaluation of the gestation as well as the maternal uterus, adnexal regions, and pelvic cul-de-sac. Transvaginal technique was performed to assess early pregnancy. COMPARISON:  None for this gestation FINDINGS: Intrauterine gestational sac: Present Yolk sac:  Absent Embryo:  Absent Cardiac Activity: N/A Heart Rate: N/A  bpm MSD: 20.7 mm   6 w   6 d Subchorionic hemorrhage:  Small subchronic hemorrhage Maternal uterus/adnexae: RIGHT ovary normal size and morphology, 1.5 x 2.5 x 2.6 cm. LEFT ovary normal size and morphology, 2.1 x 3.6 x 2.0 cm, containing a small corpus luteum. Trace free pelvic fluid. No adnexal masses. IMPRESSION: Gestational sac identified within the uterus without visualization of a yolk sac or fetal pole. Small subchronic hemorrhage. Findings  are suspicious but not yet definitive for failed pregnancy. Recommend follow-up US in  10-14 days for definitive diagnosis. This recommendation follows SRU consensus guidelines: Diagnostic Criteria for Nonviable Pregnancy Early in the First Trimester. Malva Limes Med 2013; 119:1478-29. Electronically Signed   By: Ulyses Southward M.D.   On: 09/18/2017 12:29   US Ob Transvaginal  Result Date: 09/18/2017 CLINICAL DATA:  Cramping affecting pregnancy, uncertain LMP, bleeding and cramping since Tuesday EXAM: OBSTETRIC <14 WK [place] AND TRANSVAGINAL OB US TECHNIQUE: Both transabdominal and transvaginal ultrasound examinations were performed for complete evaluation of the gestation as well as the maternal uterus, adnexal regions, and pelvic cul-de-sac. Transvaginal technique was performed to assess early pregnancy. COMPARISON:  None for this gestation FINDINGS: Intrauterine gestational sac: Present Yolk sac:  Absent Embryo:  Absent Cardiac Activity: N/A Heart Rate: N/A  bpm MSD: 20.7 mm   6 w   6 d Subchorionic hemorrhage:  Small subchronic hemorrhage Maternal uterus/adnexae: RIGHT ovary normal size and morphology, 1.5 x 2.5 x 2.6 cm. LEFT ovary normal size and morphology, 2.1 x 3.6 x 2.0 cm, containing a small corpus luteum. Trace free pelvic fluid. No adnexal masses. IMPRESSION: Gestational sac identified within the uterus without visualization of a yolk sac or fetal pole. Small subchronic hemorrhage. Findings are suspicious but not yet definitive for failed pregnancy. Recommend follow-up US in 10-14 days for definitive diagnosis. This recommendation follows SRU consensus guidelines: Diagnostic Criteria for Nonviable Pregnancy Early in the First Trimester. Malva Limes Med 2013; 562:1308-65. Electronically Signed   By: Ulyses Southward M.D.   On: 09/18/2017 12:29    Assessment/Plan: [id number] with blighted ovum opting for surgical management Risks/benefits discussed TOoOR when ready for Suction Dilation and Evacuation  Alexa [name] 09/20/2017, 9:26 AM

## 2017-09-21 ENCOUNTER — Encounter (HOSPITAL_COMMUNITY)
Admission: RE | Disposition: A | Discharge status: Home / Self Care | Payer: Self-pay | Source: Ambulatory Visit | Attending: Obstetrics and Gynecology

## 2017-09-21 ENCOUNTER — Ambulatory Visit (HOSPITAL_COMMUNITY): Payer: 59 | Admitting: Anesthesiology

## 2017-09-21 ENCOUNTER — Ambulatory Visit (HOSPITAL_COMMUNITY)
Admission: RE | Admit: 2017-09-21 | Discharge: 2017-09-21 | Disposition: A | Discharge status: Home / Self Care | Payer: 59 | Source: Ambulatory Visit | Attending: Obstetrics and Gynecology | Admitting: Obstetrics and Gynecology

## 2017-09-21 ENCOUNTER — Other Ambulatory Visit: Payer: Self-pay

## 2017-09-21 ENCOUNTER — Encounter (HOSPITAL_COMMUNITY): Payer: Self-pay

## 2017-09-21 DIAGNOSIS — Z87891 Personal history of nicotine dependence: Secondary | ICD-10-CM | POA: Insufficient documentation

## 2017-09-21 DIAGNOSIS — O021 Missed abortion: Secondary | ICD-10-CM

## 2017-09-21 DIAGNOSIS — Z8249 Family history of ischemic heart disease and other diseases of the circulatory system: Secondary | ICD-10-CM | POA: Insufficient documentation

## 2017-09-21 DIAGNOSIS — O020 Blighted ovum and nonhydatidiform mole: Secondary | ICD-10-CM | POA: Diagnosis not present

## 2017-09-21 DIAGNOSIS — Z9889 Other specified postprocedural states: Secondary | ICD-10-CM

## 2017-09-21 HISTORY — PX: DILATION AND EVACUATION: SHX1459

## 2017-09-21 LAB — ABO/RH: ABO/RH(D): AB POS

## 2017-09-21 LAB — CBC
HCT: 38.9 % (ref 36.0–46.0)
Hemoglobin: 13.4 g/dL (ref 12.0–15.0)
MCH: 29.9 pg (ref 26.0–34.0)
MCHC: 34.4 g/dL (ref 30.0–36.0)
MCV: 86.8 fL (ref 78.0–100.0)
PLATELETS: 231 10*3/uL (ref 150–400)
RBC: 4.48 MIL/uL (ref 3.87–5.11)
RDW: 13.8 % (ref 11.5–15.5)
WBC: 8.6 10*3/uL (ref 4.0–10.5)

## 2017-09-21 LAB — TYPE AND SCREEN
ABO/RH(D): AB POS
Antibody Screen: NEGATIVE

## 2017-09-21 SURGERY — DILATION AND EVACUATION, UTERUS
Anesthesia: General

## 2017-09-21 MED ORDER — LIDOCAINE HCL 1 % IJ SOLN
INTRAMUSCULAR | Status: AC
  Filled 2017-09-21: qty 20

## 2017-09-21 MED ORDER — KETOROLAC TROMETHAMINE 30 MG/ML IJ SOLN
INTRAMUSCULAR | Status: AC
  Filled 2017-09-21: qty 2

## 2017-09-21 MED ORDER — MIDAZOLAM HCL 2 MG/2ML IJ SOLN
INTRAMUSCULAR | Status: AC
  Filled 2017-09-21: qty 2

## 2017-09-21 MED ORDER — FENTANYL CITRATE (PF) 100 MCG/2ML IJ SOLN
INTRAMUSCULAR | Status: AC
  Filled 2017-09-21: qty 2

## 2017-09-21 MED ORDER — KETOROLAC TROMETHAMINE 30 MG/ML IJ SOLN
INTRAMUSCULAR | Status: DC | PRN
  Administered 2017-09-21: 30 mg via INTRAVENOUS

## 2017-09-21 MED ORDER — LIDOCAINE 2% (20 MG/ML) 5 ML SYRINGE
INTRAMUSCULAR | Status: DC | PRN
  Administered 2017-09-21: 60 mg via INTRAVENOUS

## 2017-09-21 MED ORDER — DOXYCYCLINE HYCLATE 100 MG PO TABS
200.0000 mg | ORAL_TABLET | Freq: Once | ORAL | Status: AC
  Administered 2017-09-21: 200 mg via ORAL

## 2017-09-21 MED ORDER — MIDAZOLAM HCL 5 MG/5ML IJ SOLN
INTRAMUSCULAR | Status: DC | PRN
  Administered 2017-09-21: 2 mg via INTRAVENOUS

## 2017-09-21 MED ORDER — KETOROLAC TROMETHAMINE 30 MG/ML IJ SOLN: 30.0000 mg | Freq: Once | INTRAMUSCULAR | Status: DC

## 2017-09-21 MED ORDER — OXYCODONE HCL 5 MG/5ML PO SOLN: 5.0000 mg | Freq: Once | ORAL | Status: DC | PRN

## 2017-09-21 MED ORDER — DEXAMETHASONE SODIUM PHOSPHATE 10 MG/ML IJ SOLN
INTRAMUSCULAR | Status: DC | PRN
  Administered 2017-09-21: 10 mg via INTRAVENOUS

## 2017-09-21 MED ORDER — LIDOCAINE HCL (CARDIAC) PF 100 MG/5ML IV SOSY
PREFILLED_SYRINGE | INTRAVENOUS | Status: AC
  Filled 2017-09-21: qty 5

## 2017-09-21 MED ORDER — ONDANSETRON HCL 4 MG/2ML IJ SOLN
4.0000 mg | Freq: Once | INTRAMUSCULAR | Status: AC
  Administered 2017-09-21: 4 mg via INTRAVENOUS

## 2017-09-21 MED ORDER — ACETAMINOPHEN 10 MG/ML IV SOLN
INTRAVENOUS | Status: AC
  Administered 2017-09-21: 1000 mg via INTRAVENOUS
  Filled 2017-09-21: qty 100

## 2017-09-21 MED ORDER — DOXYCYCLINE HYCLATE 100 MG PO TABS
ORAL_TABLET | ORAL | Status: AC
  Filled 2017-09-21: qty 2

## 2017-09-21 MED ORDER — FENTANYL CITRATE (PF) 100 MCG/2ML IJ SOLN
INTRAMUSCULAR | Status: DC | PRN
  Administered 2017-09-21 (×2): 50 ug via INTRAVENOUS

## 2017-09-21 MED ORDER — ONDANSETRON HCL 4 MG/2ML IJ SOLN
INTRAMUSCULAR | Status: AC
  Administered 2017-09-21: 4 mg via INTRAVENOUS
  Filled 2017-09-21: qty 2

## 2017-09-21 MED ORDER — PROMETHAZINE HCL 25 MG/ML IJ SOLN: 6.2500 mg | INTRAMUSCULAR | Status: DC | PRN

## 2017-09-21 MED ORDER — PROPOFOL 10 MG/ML IV BOLUS
INTRAVENOUS | Status: AC
  Filled 2017-09-21: qty 20

## 2017-09-21 MED ORDER — FLUCONAZOLE 150 MG PO TABS: 150.0000 mg | ORAL_TABLET | ORAL | 0 refills | Status: DC

## 2017-09-21 MED ORDER — DEXAMETHASONE SODIUM PHOSPHATE 10 MG/ML IJ SOLN
INTRAMUSCULAR | Status: AC
  Filled 2017-09-21: qty 1

## 2017-09-21 MED ORDER — ONDANSETRON HCL 4 MG/2ML IJ SOLN
INTRAMUSCULAR | Status: AC
  Filled 2017-09-21: qty 2

## 2017-09-21 MED ORDER — KETOROLAC TROMETHAMINE 30 MG/ML IJ SOLN: 30.0000 mg | Freq: Once | INTRAMUSCULAR | Status: DC | PRN

## 2017-09-21 MED ORDER — SUMATRIPTAN SUCCINATE 6 MG/0.5ML ~~LOC~~ SOLN
6.0000 mg | Freq: Once | SUBCUTANEOUS | Status: AC
  Administered 2017-09-21: 6 mg via SUBCUTANEOUS
  Filled 2017-09-21: qty 0.5

## 2017-09-21 MED ORDER — LACTATED RINGERS IV SOLN
INTRAVENOUS | Status: DC
  Administered 2017-09-21: 12:00:00 via INTRAVENOUS
  Administered 2017-09-21: 125 mL/h via INTRAVENOUS

## 2017-09-21 MED ORDER — ACETAMINOPHEN 10 MG/ML IV SOLN
1000.0000 mg | Freq: Once | INTRAVENOUS | Status: AC
Start: 2017-09-21 — End: 2017-09-21
  Administered 2017-09-21: 1000 mg via INTRAVENOUS

## 2017-09-21 MED ORDER — OXYCODONE HCL 5 MG PO TABS: 5.0000 mg | ORAL_TABLET | Freq: Once | ORAL | Status: DC | PRN

## 2017-09-21 MED ORDER — HYDROMORPHONE HCL 1 MG/ML IJ SOLN: 0.2500 mg | INTRAMUSCULAR | Status: DC | PRN

## 2017-09-21 MED ORDER — SCOPOLAMINE 1 MG/3DAYS TD PT72
1.0000 | MEDICATED_PATCH | Freq: Once | TRANSDERMAL | Status: DC
  Administered 2017-09-21: 1.5 mg via TRANSDERMAL

## 2017-09-21 MED ORDER — ONDANSETRON HCL 4 MG/2ML IJ SOLN
INTRAMUSCULAR | Status: DC | PRN
  Administered 2017-09-21: 4 mg via INTRAVENOUS

## 2017-09-21 MED ORDER — SCOPOLAMINE 1 MG/3DAYS TD PT72
MEDICATED_PATCH | TRANSDERMAL | Status: AC
  Administered 2017-09-21: 1.5 mg via TRANSDERMAL
  Filled 2017-09-21: qty 1

## 2017-09-21 MED ORDER — MEPERIDINE HCL 25 MG/ML IJ SOLN: 6.2500 mg | INTRAMUSCULAR | Status: DC | PRN

## 2017-09-21 MED ORDER — LACTATED RINGERS IV SOLN: INTRAVENOUS | Status: DC

## 2017-09-21 MED ORDER — METRONIDAZOLE 500 MG PO TABS: 500.0000 mg | ORAL_TABLET | Freq: Two times a day (BID) | ORAL | 0 refills | Status: DC

## 2017-09-21 MED ORDER — PROPOFOL 10 MG/ML IV BOLUS
INTRAVENOUS | Status: DC | PRN
  Administered 2017-09-21: 50 mg via INTRAVENOUS
  Administered 2017-09-21: 150 mg via INTRAVENOUS

## 2017-09-21 MED ORDER — ONDANSETRON HCL 4 MG PO TABS
4.0000 mg | ORAL_TABLET | Freq: Once | ORAL | Status: DC
  Filled 2017-09-21: qty 1

## 2017-09-21 SURGICAL SUPPLY — 16 items
CATH ROBINSON RED A/P 16FR (CATHETERS) ×3 IMPLANT
GLOVE BIO SURGEON STRL SZ 6.5 (GLOVE) ×2 IMPLANT
GLOVE BIO SURGEONS STRL SZ 6.5 (GLOVE) ×1
GLOVE BIOGEL PI IND STRL 7.0 (GLOVE) ×1 IMPLANT
GLOVE BIOGEL PI INDICATOR 7.0 (GLOVE) ×2
GOWN STRL REUS W/TWL LRG LVL3 (GOWN DISPOSABLE) ×6 IMPLANT
KIT BERKELEY 1ST TRIMESTER 3/8 (MISCELLANEOUS) ×3 IMPLANT
PACK VAGINAL MINOR WOMEN LF (CUSTOM PROCEDURE TRAY) ×3 IMPLANT
PAD OB MATERNITY 4.3X12.25 (PERSONAL CARE ITEMS) ×3 IMPLANT
PAD PREP 24X48 CUFFED NSTRL (MISCELLANEOUS) ×3 IMPLANT
SET BERKELEY SUCTION TUBING (SUCTIONS) ×3 IMPLANT
TOWEL OR 17X24 6PK STRL BLUE (TOWEL DISPOSABLE) ×6 IMPLANT
VACURETTE 10 RIGID CVD (CANNULA) IMPLANT
VACURETTE 7MM CVD STRL WRAP (CANNULA) IMPLANT
VACURETTE 8 RIGID CVD (CANNULA) ×3 IMPLANT
VACURETTE 9 RIGID CVD (CANNULA) IMPLANT

## 2017-09-21 NOTE — Anesthesia Preprocedure Evaluation (Addendum)
Anesthesia Evaluation  Patient identified by MRN, date of birth, ID band Patient awake    Reviewed: Allergy & Precautions, H&P , NPO status , Patient's Chart, lab work & pertinent test results  Airway Mallampati: II  TM Distance: >3 FB Neck ROM: full    Dental no notable dental hx.    Pulmonary neg pulmonary ROS, former smoker,    Pulmonary exam normal breath sounds clear to auscultation       Cardiovascular negative cardio ROS   Rhythm:regular Rate:Normal     Neuro/Psych  Headaches, negative psych ROS   GI/Hepatic negative GI ROS, Neg liver ROS,   Endo/Other  negative endocrine ROS  Renal/GU negative Renal ROS     Musculoskeletal   Abdominal   Peds  Hematology negative hematology ROS (+)   Anesthesia Other Findings   Reproductive/Obstetrics negative OB ROS                             Anesthesia Physical  Anesthesia Plan  ASA: II  Anesthesia Plan: General   Post-op Pain Management:    Induction: Intravenous  PONV Risk Score and Plan: 4 or greater and Ondansetron, Dexamethasone, Midazolam and Scopolamine patch - Pre-op  Airway Management Planned: LMA  Additional Equipment:   Intra-op Plan:   Post-operative Plan: Extubation in OR  Informed Consent: I have reviewed the patients History and Physical, chart, labs and discussed the procedure including the risks, benefits and alternatives for the proposed anesthesia with the patient or authorized representative who has indicated his/her understanding and acceptance.   Dental advisory given  Plan Discussed with: CRNA  Anesthesia Plan Comments:         Anesthesia Quick Evaluation

## 2017-09-21 NOTE — Discharge Instructions (Signed)
Nothing in vagina for 6 weeks.  No sex, tampons, and douching.  Other instructions as in [facility]. You may take Tylenol after 5 pm today.  DISCHARGE INSTRUCTIONS: D&C / D&E The following instructions have been prepared to help you care for yourself upon your return home.   Personal hygiene:  Use sanitary pads for vaginal drainage, not tampons.  Shower the day after your procedure.  NO tub baths, pools or Jacuzzis for 2-3 weeks.  Wipe front to back after using the bathroom.  Activity and limitations:  For the next 24 hours, DO NOT: -Drive a car -Advertising copywriterperate machinery -Drink alcoholic beverages -Take any medication unless instructed by your physician -Make any legal decisions or sign important papers.  Do NOT rest in bed all day.  Walking is encouraged.  Walk up and down stairs slowly.  You may resume your normal activity in one to two days or as indicated by your physician.  Sexual activity: NO intercourse for at least 2 weeks after the procedure, or as indicated by your physician.  Diet: Eat a light meal as desired this evening. You may resume your usual diet tomorrow.  Return to work: You may resume your work activities in one to two days or as indicated by your doctor.  What to expect after your surgery: Expect to have vaginal bleeding/discharge for 2-3 days and spotting for up to 10 days. It is not unusual to have soreness for up to 1-2 weeks. You may have a slight burning sensation when you urinate for the first day. Mild cramps may continue for a couple of days. You may have a regular period in 2-6 weeks.  Call your doctor for any of the following:  Excessive vaginal bleeding, saturating and changing one pad every hour.  Inability to urinate 6 hours after discharge from hospital.  Pain not relieved by pain medication.  Fever of 100.4 F or greater.  Unusual vaginal discharge or odor.  Special Instructions/Symptoms: Your throat may  feel dry or sore from the anesthesia or the breathing tube placed in your throat during surgery. If this causes discomfort, gargle with warm salt water. The discomfort should disappear within 24 hours.  If you had a scopolamine patch placed behind your ear for the management of post- operative nausea and/or vomiting:  1. The medication in the patch is effective for 72 hours, after which it should be removed.  Wrap patch in a tissue and discard in the trash. Wash hands thoroughly with soap and water. 2. You may remove the patch earlier than 72 hours if you experience unpleasant side effects which may include dry mouth, dizziness or visual disturbances. 3. Avoid touching the patch. Wash your hands with soap and water after contact with the patch.

## 2017-09-21 NOTE — Transfer of Care (Signed)
Immediate Anesthesia Transfer of Care Note  Patient: Alexa [name]  Procedure(s) Performed: DILATATION AND EVACUATION (N/A )  Patient Location: PACU  Anesthesia Type:General  Level of Consciousness: sedated  Airway & Oxygen Therapy: Patient Spontanous Breathing and Patient connected to nasal cannula oxygen  Post-op Assessment: Report given to RN and Post -op Vital signs reviewed and stable  Post vital signs: Reviewed and stable  Last Vitals:  Vitals Value Taken Time  BP    Temp    Pulse 63 09/21/2017 12:35 PM  Resp    SpO2 100 % 09/21/2017 12:35 PM  Vitals shown include unvalidated device data.  Last Pain:  Vitals:   09/21/17 0957  TempSrc: Oral  PainSc: 9       Patients Stated Pain Goal: 3 (09/21/17 0957)  Complications: No apparent anesthesia complications

## 2017-09-21 NOTE — Interval H&P Note (Signed)
History and Physical Interval Note: No change in H/P. Pt reports severe HA - given imitrex and tylenol in pre- op. Desires to proceed with suction D&E. All questions answered. Never took cytotec rx'd. No bleeding at this time  09/21/2017 11:42 AM  Alexa [name]  has presented today for surgery, with the diagnosis of incomplete mab  The various methods of treatment have been discussed with the patient and family. After consideration of risks, benefits and other options for treatment, the patient has consented to  Procedure(s): DILATATION AND EVACUATION (N/A) as a surgical intervention .  The patient's history has been reviewed, patient examined, no change in status, stable for surgery.  I have reviewed the patient's chart and labs.  Questions were answered to the patient's satisfaction.     Cathrine Musterecilia W Jatara Huettner

## 2017-09-21 NOTE — Op Note (Signed)
Operative Note    Preoperative Diagnosis: Missed ab/blighted ovum   Postoperative Diagnosis: Same   Procedure: Suction Dilation and evacuation of products of conception   Surgeon: Britt BottomBanga C, D.O.  Anesthesia: General  Fluids 1L LR EBL: 100ml UOP: 30ml   Findings: POC; appears to be mostly blood   Specimen: Products of conception to pathology   Procedure Note Consent was verified with pt. Riskls/benefits and alternatives reviewed pre- op Patient was taken to the operating room where LMA anesthesia was obtained without difficulty with pt in comfortable dorsal lithotomy position. She was then prepped and draped in the normal sterile fashion . An appropriate time out was performed. An exam under anesthesia noted an anteverted uterus.  A speculum was then placed within the vagina and the anterior lip of the cervix identified and grasped with a single toothed tenaculum. 1% lidocaine was injected at 3 and 9 o'clock. Uterus was then sounded to 810cm.  The Pratt dilators were utilized to dilate the cervix up to approximately 25. A size 8 curved cannula was then inserted into the uterine cavity and suction begun. There was moderate amount of products/blood evacuated. After 4-6 passes, a sharp curettage was then performed. Four more passes with the suction cannula were done. Minimal amount of products were returned this time. A final currettage noted a gritty texture in all four quadrants.  Hence all instruments were removed from the vagina.  The tenaculum site was noted to be hemostatic.  There was scant to no bleeding from cervix. The speculum was removed from the vagina and the patient awakened and taken to the recovery room in good condition.  Counts correct per nursing staff

## 2017-09-21 NOTE — Anesthesia Procedure Notes (Signed)
Procedure Name: LMA Insertion Date/Time: 09/21/2017 12:04 PM Performed by: Jhonnie GarnerMarshall, Etoy Mcdonnell M, CRNA Pre-anesthesia Checklist: Patient identified, Emergency Drugs available, Suction available and Patient being monitored Patient Re-evaluated:Patient Re-evaluated prior to induction Oxygen Delivery Method: Circle system utilized Preoxygenation: Pre-oxygenation with 100% oxygen Induction Type: IV induction Ventilation: Mask ventilation without difficulty LMA: LMA inserted LMA Size: 3.0 Number of attempts: 1 Placement Confirmation: positive ETCO2 and breath sounds checked- equal and bilateral Tube secured with: Tape Dental Injury: Teeth and Oropharynx as per pre-operative assessment

## 2017-09-22 NOTE — Anesthesia Postprocedure Evaluation (Signed)
Anesthesia Post Note  Patient: Alexa [name]  Procedure(s) Performed: DILATATION AND EVACUATION (N/A )     Patient location during evaluation: PACU Anesthesia Type: General Level of consciousness: sedated and patient cooperative Pain management: pain level controlled Vital Signs Assessment: post-procedure vital signs reviewed and stable Respiratory status: spontaneous breathing Cardiovascular status: stable Anesthetic complications: no    Last Vitals:  Vitals:   09/21/17 1330 09/21/17 1358  BP:  (!) 100/57  Pulse: (!) 59 62  Resp: 16 16  Temp:  36.7 C  SpO2: 100% 100%    Last Pain:  Vitals:   09/21/17 1358  TempSrc:   PainSc: 0-No pain   Pain Goal: Patients Stated Pain Goal: 3 (09/21/17 0957)               Lewie LoronJohn Kelen Laura

## 2017-09-23 ENCOUNTER — Encounter (HOSPITAL_COMMUNITY): Payer: Self-pay | Admitting: Obstetrics and Gynecology

## 2018-02-20 ENCOUNTER — Emergency Department (HOSPITAL_COMMUNITY): Admission: EM | Admit: 2018-02-20 | Discharge: 2018-02-20 | Discharge status: Absent without leave | Payer: 59

## 2018-02-20 ENCOUNTER — Encounter (HOSPITAL_COMMUNITY): Payer: Self-pay | Admitting: Emergency Medicine

## 2018-02-20 ENCOUNTER — Ambulatory Visit (HOSPITAL_COMMUNITY)
Admission: EM | Admit: 2018-02-20 | Discharge: 2018-02-20 | Disposition: A | Discharge status: Home / Self Care | Payer: 59 | Attending: Family Medicine | Admitting: Family Medicine

## 2018-02-20 ENCOUNTER — Other Ambulatory Visit: Payer: Self-pay

## 2018-02-20 DIAGNOSIS — G43001 Migraine without aura, not intractable, with status migrainosus: Secondary | ICD-10-CM | POA: Diagnosis not present

## 2018-02-20 MED ORDER — ONDANSETRON HCL 4 MG/2ML IJ SOLN
4.0000 mg | Freq: Once | INTRAMUSCULAR | Status: AC
  Administered 2018-02-20: 4 mg via INTRAMUSCULAR

## 2018-02-20 MED ORDER — TOPIRAMATE 50 MG PO TABS: 50.0000 mg | ORAL_TABLET | Freq: Two times a day (BID) | ORAL | 6 refills | Status: DC

## 2018-02-20 MED ORDER — DEXAMETHASONE SODIUM PHOSPHATE 10 MG/ML IJ SOLN
10.0000 mg | Freq: Once | INTRAMUSCULAR | Status: AC
  Administered 2018-02-20: 10 mg via INTRAMUSCULAR

## 2018-02-20 MED ORDER — DEXAMETHASONE SODIUM PHOSPHATE 10 MG/ML IJ SOLN
INTRAMUSCULAR | Status: AC
  Filled 2018-02-20: qty 1

## 2018-02-20 MED ORDER — ONDANSETRON HCL 4 MG/2ML IJ SOLN
INTRAMUSCULAR | Status: AC
  Filled 2018-02-20: qty 2

## 2018-02-20 NOTE — ED Provider Notes (Signed)
[facility]    CSN: [id number] Arrival date & time: 02/20/18  1711     History   Chief Complaint Chief Complaint  Patient presents with  . Headache    HPI Alexa [name] is a 27 y.o. female.   Pt has a history of migraines.  Pt states she saw a neurologist a long time ago, but cannot afford to follow up with them and cant afford the prescription medications.  Pt here today for a migraine that started at 1100 this morning.  He has photophobia, right-sided headache, nausea and vomiting.  Her mother also has migraines.     Past Medical History:  Diagnosis Date  . Headache(784.0)    migraines, 2-3 times a month  . Medical history non-contributory     Patient Active Problem List   Diagnosis Date Noted  . Missed abortion 09/21/2017  . Status post dilation and curettage 09/21/2017  . Migraine with aura, without mention of intractable migraine without mention of status migrainosus 05/25/2012  . Family history of Down syndrome 10/25/2010  . CHEST PAIN UNSPECIFIED 12/22/2009    Past Surgical History:  Procedure Laterality Date  . DILATION AND EVACUATION N/A 09/21/2017   Procedure: DILATATION AND EVACUATION;  Surgeon: Edwinna AreolaBanga, Cecilia Worema, DO;  Location: [facility];  Service: Gynecology;  Laterality: N/A;  . fractured arm     anesthesia to set and cast, left arm, 27 years old  . MASS EXCISION     6 yeares old, neck  . NO PAST SURGERIES      OB History    Gravida  4   Para  1   Term  1   Preterm  0   AB  1   Living  1     SAB  0   TAB  1   Ectopic  0   Multiple  0   Live Births  1            Home Medications    Prior to Admission medications   Medication Sig Start Date End Date Taking? Authorizing Provider  ibuprofen (ADVIL,MOTRIN) 600 MG tablet Take 1 tablet (600 mg total) by mouth every 6 (six) hours as needed. 09/18/17  Yes Marny LowensteinWenzel, Julie N, PA-C  oxyCODONE-acetaminophen (PERCOCET) 5-325 MG tablet Take 1 tablet by mouth  every 4 (four) hours as needed for severe pain. 09/18/17 09/18/18  Marny LowensteinWenzel, Julie N, PA-C  topiramate (TOPAMAX) 50 MG tablet Take 1 tablet (50 mg total) by mouth 2 (two) times daily. 02/20/18   Elvina SidleLauenstein, Adilson Grafton, MD    Family History Family History  Problem Relation Age of Onset  . Hypertension Father   . Heart disease Maternal Grandmother   . Hypertension Maternal Grandfather   . Anesthesia problems Neg Hx     Social History Social History   Tobacco Use  . Smoking status: Former Smoker    Types: Cigarettes  . Smokeless tobacco: Never Used  . Tobacco comment: quit about 4-5 weeks ago   Substance Use Topics  . Alcohol use: Not Currently  . Drug use: No     Allergies   Patient has no known allergies.   Review of Systems Review of Systems   Physical Exam Triage Vital Signs ED Triage Vitals  Enc Vitals Group     BP 02/20/18 1804 (!) 106/59     Pulse Rate 02/20/18 1804 64     Resp --      Temp 02/20/18 1804 98.6 F (37 C)  Temp Source 02/20/18 1804 Oral     SpO2 02/20/18 1804 100 %     Weight --      Height --      Head Circumference --      Peak Flow --      Pain Score 02/20/18 1802 8     Pain Loc --      Pain Edu? --      Excl. in GC? --    No data found.  Updated Vital Signs BP (!) 106/59 (BP Location: Left Arm)   Pulse 64   Temp 98.6 F (37 C) (Oral)   LMP 01/23/2018   SpO2 100%   Breastfeeding? Unknown    Physical Exam  Constitutional: She appears well-developed and well-nourished. She appears distressed.  HENT:  Head: Normocephalic and atraumatic.  Neck: Normal range of motion. Neck supple.  Cardiovascular: Normal rate.  Pulmonary/Chest: Effort normal.  Musculoskeletal: Normal range of motion.  Neurological: She is alert. She has normal strength.  Skin: Skin is warm.  Vitals reviewed.    UC Treatments / Results  Labs (all labs ordered are listed, but only abnormal results are displayed) Labs Reviewed - No data to  display  EKG None  Radiology No results found.  Procedures Procedures (including critical care time)  Medications Ordered in UC Medications  dexamethasone (DECADRON) injection 10 mg (has no administration in time range)  ondansetron (ZOFRAN) injection 4 mg (has no administration in time range)    Initial Impression / Assessment and Plan / UC Course  I have reviewed the triage vital signs and the nursing notes.  Pertinent labs & imaging results that were available during my care of the patient were reviewed by me and considered in my medical decision making (see chart for details).    Final Clinical Impressions(s) / UC Diagnoses   Final diagnoses:  Migraine without aura and with status migrainosus, not intractable   Discharge Instructions   None    ED Prescriptions    Medication Sig Dispense Auth. Provider   topiramate (TOPAMAX) 50 MG tablet Take 1 tablet (50 mg total) by mouth 2 (two) times daily. 60 tablet Elvina Sidle, MD     Controlled Substance Prescriptions [place] Controlled Substance Registry consulted? Not Applicable   Elvina Sidle, MD 02/20/18 [phone]

## 2018-02-20 NOTE — ED Triage Notes (Signed)
Pt has a history of migraines.  Pt states she saw a neurologist a long time ago, but cannot afford to follow up with them and cant afford the prescription medications.  Pt here today for a migraine that started at 1100 this morning.

## 2018-02-27 ENCOUNTER — Telehealth (HOSPITAL_COMMUNITY): Payer: Self-pay | Admitting: Emergency Medicine

## 2018-02-27 NOTE — Telephone Encounter (Signed)
Attempted to call back patient several times, she left vm concerned about tompomax and pregnancy. Per Dr. Delton SeeNelson, would not recommend taking it and to follow up with OB. No answer, no voicemail set up.

## 2018-04-03 NOTE — L&D Delivery Note (Signed)
Delivery Note Pt labored to complete with uncontrollable urge to push. Pt pushed for approximately 39mins and at 4:50 PM a viable female was delivered via Vaginal, Spontaneous (Presentation:ROA ;  ).  APGAR: 8, 9; weight pending  .   Placenta status:delivered intact schultz , .  Cord: 3vc with the following complications:body cord x 1 .  Cord pH: n/a  Anesthesia:  Epidural Episiotomy: None Lacerations: 2nd degree Suture Repair: 2.0 vicryl 4-0 vicryl Est. Blood Loss (mL): 114  Mom to postpartum.  Baby to Couplet care / Skin to Skin  Considering whether wants circumcision.  Alexa [name] 10/28/2018, 5:18 PM

## 2018-04-04 LAB — OB RESULTS CONSOLE GC/CHLAMYDIA
Chlamydia: NEGATIVE
Gonorrhea: NEGATIVE

## 2018-04-04 LAB — OB RESULTS CONSOLE HIV ANTIBODY (ROUTINE TESTING): HIV: NONREACTIVE

## 2018-04-04 LAB — OB RESULTS CONSOLE RPR: RPR: NONREACTIVE

## 2018-04-04 LAB — OB RESULTS CONSOLE GBS: GBS: NEGATIVE

## 2018-04-04 LAB — OB RESULTS CONSOLE HEPATITIS B SURFACE ANTIGEN: Hepatitis B Surface Ag: NEGATIVE

## 2018-04-04 LAB — OB RESULTS CONSOLE RUBELLA ANTIBODY, IGM: Rubella: IMMUNE

## 2018-08-01 ENCOUNTER — Other Ambulatory Visit: Payer: Self-pay

## 2018-08-01 ENCOUNTER — Inpatient Hospital Stay (HOSPITAL_COMMUNITY)
Admission: AD | Admit: 2018-08-01 | Discharge: 2018-08-01 | Disposition: A | Discharge status: Home / Self Care | Payer: Medicaid Other | Attending: Obstetrics and Gynecology | Admitting: Obstetrics and Gynecology

## 2018-08-01 ENCOUNTER — Encounter (HOSPITAL_COMMUNITY): Payer: Self-pay | Admitting: Advanced Practice Midwife

## 2018-08-01 DIAGNOSIS — W109XXA Fall (on) (from) unspecified stairs and steps, initial encounter: Secondary | ICD-10-CM | POA: Diagnosis not present

## 2018-08-01 DIAGNOSIS — Z3689 Encounter for other specified antenatal screening: Secondary | ICD-10-CM

## 2018-08-01 DIAGNOSIS — Z87891 Personal history of nicotine dependence: Secondary | ICD-10-CM | POA: Insufficient documentation

## 2018-08-01 DIAGNOSIS — O9A219 Injury, poisoning and certain other consequences of external causes complicating pregnancy, unspecified trimester: Secondary | ICD-10-CM | POA: Diagnosis present

## 2018-08-01 DIAGNOSIS — Z3A27 27 weeks gestation of pregnancy: Secondary | ICD-10-CM | POA: Diagnosis not present

## 2018-08-01 DIAGNOSIS — O9A212 Injury, poisoning and certain other consequences of external causes complicating pregnancy, second trimester: Secondary | ICD-10-CM

## 2018-08-01 DIAGNOSIS — Z79899 Other long term (current) drug therapy: Secondary | ICD-10-CM | POA: Diagnosis not present

## 2018-08-01 DIAGNOSIS — W19XXXA Unspecified fall, initial encounter: Secondary | ICD-10-CM | POA: Diagnosis not present

## 2018-08-01 DIAGNOSIS — Y9301 Activity, walking, marching and hiking: Secondary | ICD-10-CM | POA: Insufficient documentation

## 2018-08-01 NOTE — Discharge Instructions (Signed)

## 2018-08-01 NOTE — MAU Note (Signed)
Fell down 5 stairs on her bottom last night. Went to MD office visit today did a stress test and was told to come in for longer monitoring. Denies any pain or cramping at this time. Good fetal movement felt since the fall. Denies any vag bleeding . Has had vaginal discharge prior to fall.

## 2018-08-01 NOTE — MAU Provider Note (Signed)
History     CSN: [id number]  Arrival date and time: 08/01/18 1753   First Provider Initiated Contact with Patient 08/01/18 1825      Chief Complaint  Patient presents with  . Fall   Alexa [name] is a 28 y.o. G4P1011 at [redacted]w[redacted]d who presents today after at fall. She states that last evening on 07/31/2018 at around 2330 she fell walking down some steps and landed on her buttocks. She denies any pain. She denies any contractions, VB or LOF. She reports normal fetal movement since the fall. She was seen in office today and had NST. She was sent here afterward for further monitoring.   Fall  The accident occurred 12 to 24 hours ago (2330 on 07/31/2018). The fall occurred while walking. She fell from a height of 3 to 5 ft. She landed on hard floor. The point of impact was the buttocks. The pain is at a severity of 0/10. The patient is experiencing no pain. Pertinent negatives include no abdominal pain, fever, headaches, nausea or vomiting.    OB History    Gravida  4   Para  1   Term  1   Preterm  0   AB  1   Living  1     SAB  0   TAB  1   Ectopic  0   Multiple  0   Live Births  1           Past Medical History:  Diagnosis Date  . Headache(784.0)    migraines, 2-3 times a month  . Medical history non-contributory     Past Surgical History:  Procedure Laterality Date  . DILATION AND EVACUATION N/A 09/21/2017   Procedure: DILATATION AND EVACUATION;  Surgeon: Edwinna Areola, DO;  Location: [facility];  Service: Gynecology;  Laterality: N/A;  . fractured arm     anesthesia to set and cast, left arm, 28 years old  . MASS EXCISION     6 yeares old, neck  . NO PAST SURGERIES      Family History  Problem Relation Age of Onset  . Hypertension Father   . Heart disease Maternal Grandmother   . Hypertension Maternal Grandfather   . Anesthesia problems Neg Hx     Social History   Tobacco Use  . Smoking status: Former Smoker    Types: Cigarettes   . Smokeless tobacco: Never Used  . Tobacco comment: quit about 4-5 weeks ago   Substance Use Topics  . Alcohol use: Not Currently  . Drug use: No    Allergies: No Known Allergies  Medications Prior to Admission  Medication Sig Dispense Refill Last Dose  . ibuprofen (ADVIL,MOTRIN) 600 MG tablet Take 1 tablet (600 mg total) by mouth every 6 (six) hours as needed. 30 tablet 0 02/20/2018 at Unknown time  . oxyCODONE-acetaminophen (PERCOCET) 5-325 MG tablet Take 1 tablet by mouth every 4 (four) hours as needed for severe pain. 12 tablet 0 Unknown at Unknown time  . topiramate (TOPAMAX) 50 MG tablet Take 1 tablet (50 mg total) by mouth 2 (two) times daily. 60 tablet 6     Review of Systems  Constitutional: Negative for chills and fever.  Gastrointestinal: Negative for abdominal pain, nausea and vomiting.  Genitourinary: Negative for pelvic pain and vaginal bleeding.  Neurological: Negative for dizziness, syncope and headaches.       Has been feeling faint "in general" and feels this is due to lack of appetite and not  eating well enough.    Physical Exam   Blood pressure 113/60, pulse 87, temperature 98.4 F (36.9 C), resp. rate 18, height 5\' 3"  (1.6 m), weight 64 kg, last menstrual period 01/23/2018, unknown if currently breastfeeding.  Physical Exam  Nursing note and vitals reviewed. Constitutional: She is oriented to person, place, and time. She appears well-developed and well-nourished. No distress.  HENT:  Head: Normocephalic.  Cardiovascular: Normal rate.  Respiratory: Effort normal.  GI: Soft. There is no abdominal tenderness. There is no rebound.  Musculoskeletal: Normal range of motion.        General: No tenderness.  Neurological: She is alert and oriented to person, place, and time.  Skin: Skin is warm and dry.  Psychiatric: She has a normal mood and affect.   NST:  Baseline: 135 Variability: moderate Accels: 10x10 Decels: none Toco: none  MAU Course   Procedures  MDM 7:53 PM discussed with Dr. Macon LargeAnyanwu, and she reviewed tracing. Agrees patient is ok for DC home.   Assessment and Plan   1. Trauma during pregnancy   2. Fall, initial encounter   3. [redacted] weeks gestation of pregnancy   4. NST (non-stress test) reactive    DC home 3rd Trimester precautions  Bleeding precautions PTL precautions  Fetal kick counts RX: none  Return to MAU as needed FU with OB as planned  Follow-up Information    [facility].   Contact information: [address]  [address] [place] [id number] [phone]          Thressa ShellerHeather  DNP, CNM  08/01/18  7:56 PM

## 2018-10-14 LAB — OB RESULTS CONSOLE GBS: GBS: NEGATIVE

## 2018-10-28 ENCOUNTER — Inpatient Hospital Stay (HOSPITAL_COMMUNITY): Payer: Medicaid Other | Admitting: Anesthesiology

## 2018-10-28 ENCOUNTER — Other Ambulatory Visit: Payer: Self-pay

## 2018-10-28 ENCOUNTER — Inpatient Hospital Stay (HOSPITAL_COMMUNITY)
Admission: AD | Admit: 2018-10-28 | Discharge: 2018-10-29 | DRG: 807 | Disposition: A | Discharge status: Home / Self Care | Payer: Medicaid Other | Attending: Obstetrics and Gynecology | Admitting: Obstetrics and Gynecology

## 2018-10-28 ENCOUNTER — Encounter (HOSPITAL_COMMUNITY): Payer: Self-pay | Admitting: General Practice

## 2018-10-28 DIAGNOSIS — Z3A38 38 weeks gestation of pregnancy: Secondary | ICD-10-CM | POA: Diagnosis not present

## 2018-10-28 DIAGNOSIS — Z3490 Encounter for supervision of normal pregnancy, unspecified, unspecified trimester: Secondary | ICD-10-CM

## 2018-10-28 DIAGNOSIS — Z1159 Encounter for screening for other viral diseases: Secondary | ICD-10-CM

## 2018-10-28 DIAGNOSIS — O26893 Other specified pregnancy related conditions, third trimester: Secondary | ICD-10-CM | POA: Diagnosis present

## 2018-10-28 DIAGNOSIS — Z87891 Personal history of nicotine dependence: Secondary | ICD-10-CM | POA: Diagnosis not present

## 2018-10-28 HISTORY — DX: Encounter for supervision of normal pregnancy, unspecified, unspecified trimester: Z34.90

## 2018-10-28 LAB — CBC
HCT: 39.5 % (ref 36.0–46.0)
Hemoglobin: 13.2 g/dL (ref 12.0–15.0)
MCH: 31.2 pg (ref 26.0–34.0)
MCHC: 33.4 g/dL (ref 30.0–36.0)
MCV: 93.4 fL (ref 80.0–100.0)
Platelets: 160 10*3/uL (ref 150–400)
RBC: 4.23 MIL/uL (ref 3.87–5.11)
RDW: 14.6 % (ref 11.5–15.5)
WBC: 11.7 10*3/uL — ABNORMAL HIGH (ref 4.0–10.5)
nRBC: 0 % (ref 0.0–0.2)

## 2018-10-28 LAB — TYPE AND SCREEN
ABO/RH(D): AB POS
Antibody Screen: NEGATIVE

## 2018-10-28 LAB — SARS CORONAVIRUS 2 BY RT PCR (HOSPITAL ORDER, PERFORMED IN ~~LOC~~ HOSPITAL LAB): SARS Coronavirus 2: NEGATIVE

## 2018-10-28 MED ORDER — DIPHENHYDRAMINE HCL 25 MG PO CAPS: 25.0000 mg | ORAL_CAPSULE | Freq: Four times a day (QID) | ORAL | Status: DC | PRN

## 2018-10-28 MED ORDER — TERBUTALINE SULFATE 1 MG/ML IJ SOLN: 0.2500 mg | Freq: Once | INTRAMUSCULAR | Status: DC | PRN

## 2018-10-28 MED ORDER — OXYCODONE HCL 5 MG PO TABS: 5.0000 mg | ORAL_TABLET | ORAL | Status: DC | PRN

## 2018-10-28 MED ORDER — LACTATED RINGERS IV SOLN: 500.0000 mL | INTRAVENOUS | Status: DC | PRN

## 2018-10-28 MED ORDER — IBUPROFEN 600 MG PO TABS
600.0000 mg | ORAL_TABLET | Freq: Four times a day (QID) | ORAL | Status: DC
  Administered 2018-10-28 – 2018-10-29 (×4): 600 mg via ORAL
  Filled 2018-10-28 (×4): qty 1

## 2018-10-28 MED ORDER — LACTATED RINGERS IV SOLN: 500.0000 mL | Freq: Once | INTRAVENOUS | Status: DC

## 2018-10-28 MED ORDER — PHENYLEPHRINE 40 MCG/ML (10ML) SYRINGE FOR IV PUSH (FOR BLOOD PRESSURE SUPPORT): 80.0000 ug | PREFILLED_SYRINGE | INTRAVENOUS | Status: DC | PRN

## 2018-10-28 MED ORDER — TETANUS-DIPHTH-ACELL PERTUSSIS 5-2.5-18.5 LF-MCG/0.5 IM SUSP: 0.5000 mL | Freq: Once | INTRAMUSCULAR | Status: DC

## 2018-10-28 MED ORDER — BENZOCAINE-MENTHOL 20-0.5 % EX AERO
1.0000 "application " | INHALATION_SPRAY | CUTANEOUS | Status: DC | PRN
  Administered 2018-10-28: 1 via TOPICAL
  Filled 2018-10-28: qty 56

## 2018-10-28 MED ORDER — ONDANSETRON HCL 4 MG PO TABS: 4.0000 mg | ORAL_TABLET | ORAL | Status: DC | PRN

## 2018-10-28 MED ORDER — FENTANYL-BUPIVACAINE-NACL 0.5-0.125-0.9 MG/250ML-% EP SOLN
12.0000 mL/h | EPIDURAL | Status: DC | PRN
  Administered 2018-10-28: 12 mL/h via EPIDURAL

## 2018-10-28 MED ORDER — PRENATAL MULTIVITAMIN CH
1.0000 | ORAL_TABLET | Freq: Every day | ORAL | Status: DC
  Administered 2018-10-29: 1 via ORAL
  Filled 2018-10-28: qty 1

## 2018-10-28 MED ORDER — BUTORPHANOL TARTRATE 1 MG/ML IJ SOLN: 1.0000 mg | INTRAMUSCULAR | Status: DC | PRN

## 2018-10-28 MED ORDER — ONDANSETRON HCL 4 MG/2ML IJ SOLN: 4.0000 mg | INTRAMUSCULAR | Status: DC | PRN

## 2018-10-28 MED ORDER — EPHEDRINE 5 MG/ML INJ: 10.0000 mg | INTRAVENOUS | Status: DC | PRN

## 2018-10-28 MED ORDER — DIPHENHYDRAMINE HCL 50 MG/ML IJ SOLN: 12.5000 mg | INTRAMUSCULAR | Status: DC | PRN

## 2018-10-28 MED ORDER — LIDOCAINE HCL (PF) 1 % IJ SOLN
30.0000 mL | INTRAMUSCULAR | Status: DC | PRN
  Filled 2018-10-28: qty 30

## 2018-10-28 MED ORDER — SENNOSIDES-DOCUSATE SODIUM 8.6-50 MG PO TABS
2.0000 | ORAL_TABLET | ORAL | Status: DC
  Administered 2018-10-28: 2 via ORAL
  Filled 2018-10-28: qty 2

## 2018-10-28 MED ORDER — ZOLPIDEM TARTRATE 5 MG PO TABS: 5.0000 mg | ORAL_TABLET | Freq: Every evening | ORAL | Status: DC | PRN

## 2018-10-28 MED ORDER — OXYTOCIN 40 UNITS IN NORMAL SALINE INFUSION - SIMPLE MED: 2.5000 [IU]/h | INTRAVENOUS | Status: DC

## 2018-10-28 MED ORDER — WITCH HAZEL-GLYCERIN EX PADS: 1.0000 "application " | MEDICATED_PAD | CUTANEOUS | Status: DC | PRN

## 2018-10-28 MED ORDER — OXYTOCIN 40 UNITS IN NORMAL SALINE INFUSION - SIMPLE MED
1.0000 m[IU]/min | INTRAVENOUS | Status: DC
  Administered 2018-10-28: 2 m[IU]/min via INTRAVENOUS
  Filled 2018-10-28: qty 1000

## 2018-10-28 MED ORDER — LACTATED RINGERS IV SOLN
INTRAVENOUS | Status: DC
  Administered 2018-10-28 (×2): via INTRAVENOUS

## 2018-10-28 MED ORDER — ACETAMINOPHEN 325 MG PO TABS: 650.0000 mg | ORAL_TABLET | ORAL | Status: DC | PRN

## 2018-10-28 MED ORDER — SOD CITRATE-CITRIC ACID 500-334 MG/5ML PO SOLN: 30.0000 mL | ORAL | Status: DC | PRN

## 2018-10-28 MED ORDER — COCONUT OIL OIL: 1.0000 "application " | TOPICAL_OIL | Status: DC | PRN

## 2018-10-28 MED ORDER — OXYCODONE HCL 5 MG PO TABS: 10.0000 mg | ORAL_TABLET | ORAL | Status: DC | PRN

## 2018-10-28 MED ORDER — OXYCODONE-ACETAMINOPHEN 5-325 MG PO TABS: 2.0000 | ORAL_TABLET | ORAL | Status: DC | PRN

## 2018-10-28 MED ORDER — OXYTOCIN BOLUS FROM INFUSION
500.0000 mL | Freq: Once | INTRAVENOUS | Status: AC
  Administered 2018-10-28: 500 mL via INTRAVENOUS

## 2018-10-28 MED ORDER — SODIUM CHLORIDE (PF) 0.9 % IJ SOLN
INTRAMUSCULAR | Status: DC | PRN
  Administered 2018-10-28: 12 mL/h via EPIDURAL

## 2018-10-28 MED ORDER — SIMETHICONE 80 MG PO CHEW: 80.0000 mg | CHEWABLE_TABLET | ORAL | Status: DC | PRN

## 2018-10-28 MED ORDER — LIDOCAINE HCL (PF) 1 % IJ SOLN
INTRAMUSCULAR | Status: DC | PRN
  Administered 2018-10-28: 5 mL via EPIDURAL

## 2018-10-28 MED ORDER — ONDANSETRON HCL 4 MG/2ML IJ SOLN: 4.0000 mg | Freq: Four times a day (QID) | INTRAMUSCULAR | Status: DC | PRN

## 2018-10-28 MED ORDER — OXYCODONE-ACETAMINOPHEN 5-325 MG PO TABS: 1.0000 | ORAL_TABLET | ORAL | Status: DC | PRN

## 2018-10-28 MED ORDER — FENTANYL-BUPIVACAINE-NACL 0.5-0.125-0.9 MG/250ML-% EP SOLN
12.0000 mL/h | EPIDURAL | Status: DC | PRN
  Filled 2018-10-28: qty 250

## 2018-10-28 MED ORDER — DIBUCAINE (PERIANAL) 1 % EX OINT: 1.0000 "application " | TOPICAL_OINTMENT | CUTANEOUS | Status: DC | PRN

## 2018-10-28 NOTE — Progress Notes (Signed)
Patient ID: Alexa [name], female   DOB: 17-Nov-1990, 28 y.o.   MRN: [id number] Pt doing well on pitocin for induction. Not appreciating contractions VSS EFM - 120s, cat 1 TOCO - ctxs q 1-2 mins; pitocin at 53mus SVE 4.5/80/-2, midposition  A/P: G4P1 at 28 5/7wks for elective iol - stable         AROM performed with clear fluid noted         Continue pitocin per protocol          Pain control prn         Anticipate svd         Covid neg

## 2018-10-28 NOTE — H&P (Signed)
Alexa [name] is a [age] y.72o.G4P1011 female presenting at 3638 5/7wks for elective iol at term. Pt is dated per LMP which was confirmed by a 10week US. She declined genetic screen but had negative chromosomal testing. She has had a relatively benign prenatal course. GBS is negative.  OB History    Gravida  4   Para  1   Term  1   Preterm  0   AB  1   Living  1     SAB  0   TAB  1   Ectopic  0   Multiple  0   Live Births  1          Past Medical History:  Diagnosis Date  . Headache(784.0)    migraines, 2-3 times a month  . Medical history non-contributory    Past Surgical History:  Procedure Laterality Date  . DILATION AND EVACUATION N/A 09/21/2017   Procedure: DILATATION AND EVACUATION;  Surgeon: Edwinna AreolaBanga, Cecilia Worema, DO;  Location: [facility];  Service: Gynecology;  Laterality: N/A;  . fractured arm     anesthesia to set and cast, left arm, 28 years old  . MASS EXCISION     6 yeares old, neck  . NO PAST SURGERIES     Family History: family history includes Heart disease in her maternal grandmother; Hypertension in her father and maternal grandfather. Social History:  reports that she has quit smoking. Her smoking use included cigarettes. She has never used smokeless tobacco. She reports previous alcohol use. She reports that she does not use drugs.     Maternal Diabetes: No Genetic Screening: Declined Maternal Ultrasounds/Referrals: Normal Fetal Ultrasounds or other Referrals:  None Maternal Substance Abuse:  Yes:  Type: Smoker - stopped when found out pregnant Significant Maternal Medications:  None Significant Maternal Lab Results:  Group B Strep negative Other Comments:  None  Review of Systems  Constitutional: Positive for malaise/fatigue. Negative for chills, fever and weight loss.  Eyes: Negative for blurred vision and double vision.  Respiratory: Negative for shortness of breath.   Cardiovascular: Negative for chest pain.  Gastrointestinal:  Positive for abdominal pain. Negative for heartburn, nausea and vomiting.  Genitourinary: Negative for dysuria.  Musculoskeletal: Negative for myalgias.  Skin: Negative for itching and rash.  Neurological: Negative for dizziness and headaches.  Endo/Heme/Allergies: Negative for environmental allergies. Does not bruise/bleed easily.  Psychiatric/Behavioral: Negative for depression, hallucinations, substance abuse and suicidal ideas. The patient is not nervous/anxious.    Maternal Medical History:  Reason for admission: Nausea. Elective iol  Contractions: Frequency: rare.   Perceived severity is mild.    Fetal activity: Perceived fetal activity is normal.   Last perceived fetal movement was within the past hour.    Prenatal complications: no prenatal complications Prenatal Complications - Diabetes: none.      Last menstrual period 01/23/2018, unknown if currently breastfeeding. Maternal Exam:  Uterine Assessment: Contraction strength is moderate.  Contraction frequency is rare.   Abdomen: Patient reports generalized tenderness.  Estimated fetal weight is AGA.   Fetal presentation: vertex  Introitus: Normal vulva. Vulva is negative for condylomata and lesion.  Normal vagina.  Vagina is negative for condylomata.  Pelvis: adequate for delivery.   Cervix: Cervix evaluated by digital exam.     Fetal Exam Fetal Monitor Review: Variability: moderate (6-25 bpm).   Pattern: accelerations present and no decelerations.    Fetal State Assessment: Category I - tracings are normal.     Physical Exam  Constitutional:  She is oriented to person, place, and time. She appears well-developed and well-nourished.  Neck: Normal range of motion.  Cardiovascular: Normal rate.  Respiratory: Effort normal.  GI: Soft. There is generalized abdominal tenderness.  Genitourinary:    Vulva, vagina and uterus normal.     No vulval condylomata or lesion noted.   Musculoskeletal: Normal range of  motion.  Neurological: She is alert and oriented to person, place, and time.  Skin: Skin is warm.  Psychiatric: She has a normal mood and affect. Her behavior is normal. Judgment and thought content normal.    Prenatal labs: ABO, Rh:   Antibody:   Rubella:   RPR:    HBsAg:    HIV:    GBS:     Assessment/Plan: 15QM [id number] at 42 5/[redacted]wks gestation for elective iol at term Admit Covid screen Pitocin/AROM for IOL Pain control prn GBS neg  Venetia Night Banga 10/28/2018, 9:44 AM

## 2018-10-28 NOTE — Anesthesia Procedure Notes (Signed)
Epidural Patient location during procedure: OB Start time: 10/28/2018 2:48 PM End time: 10/28/2018 2:54 PM  Staffing Anesthesiologist: Effie Berkshire, MD Performed: anesthesiologist   Preanesthetic Checklist Completed: patient identified, site marked, surgical consent, pre-op evaluation, timeout performed, IV checked, risks and benefits discussed and monitors and equipment checked  Epidural Patient position: sitting Prep: ChloraPrep Patient monitoring: heart rate, continuous pulse ox and blood pressure Approach: midline Location: L3-L4 Injection technique: LOR saline  Needle:  Needle type: Tuohy  Needle gauge: 17 G Needle length: 9 cm Catheter type: closed end flexible Catheter size: 20 Guage Test dose: negative and 1.5% lidocaine  Assessment Events: blood not aspirated, injection not painful, no injection resistance and no paresthesia  Additional Notes LOR @ 4.5  Patient identified. Risks/Benefits/Options discussed with patient including but not limited to bleeding, infection, nerve damage, paralysis, failed block, incomplete pain control, headache, blood pressure changes, nausea, vomiting, reactions to medications, itching and postpartum back pain. Confirmed with bedside nurse the patient's most recent platelet count. Confirmed with patient that they are not currently taking any anticoagulation, have any bleeding history or any family history of bleeding disorders. Patient expressed understanding and wished to proceed. All questions were answered. Sterile technique was used throughout the entire procedure. Please see nursing notes for vital signs. Test dose was given through epidural catheter and negative prior to continuing to dose epidural or start infusion. Warning signs of high block given to the patient including shortness of breath, tingling/numbness in hands, complete motor block, or any concerning symptoms with instructions to call for help. Patient was given instructions  on fall risk and not to get out of bed. All questions and concerns addressed with instructions to call with any issues or inadequate analgesia.    Reason for block:procedure for pain

## 2018-10-28 NOTE — Anesthesia Preprocedure Evaluation (Signed)
Anesthesia Evaluation  Patient identified by MRN, date of birth, ID band Patient awake    Reviewed: Allergy & Precautions, Patient's Chart, lab work & pertinent test results  Airway Mallampati: I       Dental no notable dental hx.    Pulmonary former smoker,    Pulmonary exam normal        Cardiovascular negative cardio ROS Normal cardiovascular exam     Neuro/Psych  Headaches,    GI/Hepatic Neg liver ROS,   Endo/Other    Renal/GU      Musculoskeletal   Abdominal   Peds  Hematology negative hematology ROS (+)   Anesthesia Other Findings   Reproductive/Obstetrics (+) Pregnancy                             Anesthesia Physical Anesthesia Plan  ASA: II  Anesthesia Plan: Epidural   Post-op Pain Management:    Induction:   PONV Risk Score and Plan:   Airway Management Planned:   Additional Equipment:   Intra-op Plan:   Post-operative Plan:   Informed Consent: I have reviewed the patients History and Physical, chart, labs and discussed the procedure including the risks, benefits and alternatives for the proposed anesthesia with the patient or authorized representative who has indicated his/her understanding and acceptance.       Plan Discussed with:   Anesthesia Plan Comments: (Lab Results      Component                Value               Date                      WBC                      11.7 (H)            10/28/2018                HGB                      13.2                10/28/2018                HCT                      39.5                10/28/2018                MCV                      93.4                10/28/2018                PLT                      160                 10/28/2018            COVID-19 Labs  No results for input(s): DDIMER, FERRITIN, LDH, CRP in the last 72 hours.  Lab Results      Component  Value               Date                       [id number]              NEGATIVE            10/28/2018            )        Anesthesia Quick Evaluation

## 2018-10-29 LAB — ABO/RH: ABO/RH(D): AB POS

## 2018-10-29 LAB — CBC
HCT: 33.5 % — ABNORMAL LOW (ref 36.0–46.0)
Hemoglobin: 10.9 g/dL — ABNORMAL LOW (ref 12.0–15.0)
MCH: 30.9 pg (ref 26.0–34.0)
MCHC: 32.5 g/dL (ref 30.0–36.0)
MCV: 94.9 fL (ref 80.0–100.0)
Platelets: 165 10*3/uL (ref 150–400)
RBC: 3.53 MIL/uL — ABNORMAL LOW (ref 3.87–5.11)
RDW: 14.6 % (ref 11.5–15.5)
WBC: 15.1 10*3/uL — ABNORMAL HIGH (ref 4.0–10.5)
nRBC: 0 % (ref 0.0–0.2)

## 2018-10-29 LAB — RPR: RPR Ser Ql: NONREACTIVE

## 2018-10-29 MED ORDER — PRENATAL MULTIVITAMIN CH: 1.0000 | ORAL_TABLET | Freq: Every day | ORAL | 3 refills | Status: DC

## 2018-10-29 MED ORDER — IBUPROFEN 600 MG PO TABS: 600.0000 mg | ORAL_TABLET | Freq: Four times a day (QID) | ORAL | 1 refills | Status: DC | PRN

## 2018-10-29 NOTE — Progress Notes (Addendum)
Post Partum Day 1 Subjective: no complaints, up ad lib, voiding, tolerating PO and nl lochia, pain controlled  Objective: Blood pressure (!) 90/56, pulse 91, temperature 98.8 F (37.1 C), temperature source Oral, resp. rate 16, height 5\' 3"  (1.6 m), weight 72.6 kg, last menstrual period 01/23/2018, SpO2 99 %, unknown if currently breastfeeding.  Physical Exam:  General: alert and no distress Lochia: appropriate Uterine Fundus: firm  Recent Labs    10/28/18 1010 10/29/18 0513  HGB 13.2 10.9*  HCT 39.5 33.5*    Assessment/Plan: Plan for discharge tomorrow, Breastfeeding and Lactation consult.  Routine PP care.    Pt desires discharge at 24hrs if baby is ready to go.  D/C with Motrin and PNV   LOS: 1 day   Alexa [name] 10/29/2018, 7:27 AM

## 2018-10-29 NOTE — Anesthesia Postprocedure Evaluation (Signed)
Anesthesia Post Note  Patient: Alexa [name]  Procedure(s) Performed: AN AD HOC LABOR EPIDURAL     Patient location during evaluation: Mother Baby Anesthesia Type: Epidural Level of consciousness: awake Pain management: satisfactory to patient Vital Signs Assessment: post-procedure vital signs reviewed and stable Respiratory status: spontaneous breathing Cardiovascular status: stable Anesthetic complications: no    Last Vitals:  Vitals:   10/28/18 2355 10/29/18 0345  BP: 99/63 (!) 90/56  Pulse: 84 91  Resp: 18 16  Temp: 37.6 C 37.1 C  SpO2: 98% 99%    Last Pain:  Vitals:   10/29/18 0715  TempSrc:   PainSc: 4    Pain Goal:                   [employer]

## 2018-10-29 NOTE — Lactation Note (Signed)
This note was copied from a baby's chart. Lactation Consultation Note  Patient Name: Alexa [name] [id number] Date: 10/29/2018 Reason for consult: Initial assessment;Term  P2 mother whose infant is now 52 hours old.  Mother breast fed her first child (now 27 years old) for a couple of months.  Baby was being examined by the MD when I arrived.  Mother had no immediate questions/concerns related to breast feeding.  She did comment that he latches well to the left side but has some difficulty latching well to the right side.  Offered to assist her at the next feeding.  Mother just fed him about 20 minutes prior to my arrival.  Asked her to call for assistance when baby is ready to feed.  Encouraged to continue feeding 8-12 times/24 hours or sooner if he shows feeding cues.  Reviewed cues.  Mother is familiar with hand expression and is able to express a couple of colostrum drops at this time.  Colostrum container provided for any EBM she may obtain with hand expression.  Milk storage times reviewed and finger feeding demonstrated.  Cautioned parents about the use of a pacifier since baby has a pacifier in his crib.  Provided a good explanation as to why this may not be a good idea until breast feeding is well established.  Mother verbalized understanding.  Mother works from home and does not have a DEBP for home use.  She has contacted the [facility] office and may obtain a pump after discharge as needed.  I provided a manual pump with instructions for use since mother may be discharged later this evening.  #24 flange size changed to a #27 for greater fit and comfort.  Engorgement prevention/treatment reviewed.  Father present.   Maternal Data Formula Feeding for Exclusion: No Has patient been taught Hand Expression?: Yes Does the patient have breastfeeding experience prior to this delivery?: Yes  Feeding Feeding Type: Breast Fed  LATCH Score                    Interventions    Lactation Tools Discussed/Used WIC Program: Yes Pump Review: Setup, frequency, and cleaning;Milk Storage Initiated by:: Daulton Harbaugh Date initiated:: 10/29/18   Consult Status Consult Status: Follow-up Date: 10/30/18 Follow-up type: In-patient    Lakota Markgraf R Daleisa Halperin 10/29/2018, 10:30 AM

## 2018-10-29 NOTE — Discharge Summary (Signed)
OB Discharge Summary     Patient Name: Alexa [name] DOB: 12/09/1990 MRN: [id number]  Date of admission: 10/28/2018 Delivering MD: Pryor OchoaBANGA, CECILIA [facility]   Date of discharge: 10/29/2018  Admitting diagnosis: Induction Intrauterine pregnancy: 4159w5d     Secondary diagnosis:  Active Problems:   Term pregnancy   SVD (spontaneous vaginal delivery)  Additional problems: N/a     Discharge diagnosis: Term Pregnancy Delivered                                                                                                Post partum procedures:n/a  Augmentation: AROM and Pitocin  Complications: None  Hospital course:  Induction of Labor With Vaginal Delivery   28 y.o. yo [id number] at 3359w5d was admitted to the hospital 10/28/2018 for induction of labor.  Indication for induction: Favorable cervix at term.  Patient had an uncomplicated labor course as follows: Membrane Rupture Time/Date: 1:24 PM ,10/28/2018   Intrapartum Procedures: Episiotomy: None [1]                                         Lacerations:  2nd degree [3]  Patient had delivery of a Viable infant.  Information for the patient's newborn:  Revonda HumphreyGonzalez-Ferreras, Boy Greyson [[id number]][[id number]]      10/28/2018  Details of delivery can be found in separate delivery note.  Patient had a routine postpartum course. Patient is discharged home 10/29/18.  Physical exam  Vitals:   10/28/18 1934 10/28/18 2355 10/29/18 0345 10/29/18 0815  BP: 108/71 99/63 (!) 90/56 100/73  Pulse: 92 84 91 76  Resp: 18 18 16 18   Temp: 98.9 F (37.2 C) 99.7 F (37.6 C) 98.8 F (37.1 C) 98 F (36.7 C)  TempSrc: Oral Oral Oral Oral  SpO2: 100% 98% 99%   Weight:      Height:       General: alert and no distress Lochia: appropriate Uterine Fundus: firm Labs: Lab Results  Component Value Date   WBC 15.1 (H) 10/29/2018   HGB 10.9 (L) 10/29/2018   HCT 33.5 (L) 10/29/2018   MCV 94.9 10/29/2018   PLT 165 10/29/2018   CMP Latest Ref Rng & Units  09/04/2015  Glucose 65 - 99 mg/dL 413(K102(H)  BUN 6 - 20 mg/dL 12  Creatinine 4.400.44 - 1.021.00 mg/dL 7.250.83  Sodium 366135 - 440145 mmol/L 138  Potassium 3.5 - 5.1 mmol/L 3.8  Chloride 101 - 111 mmol/L 106  CO2 22 - 32 mmol/L 23  Calcium 8.9 - 10.3 mg/dL 9.0  Total Protein 6.5 - 8.1 g/dL 7.3  Total Bilirubin 0.3 - 1.2 mg/dL 1.1  Alkaline Phos 38 - 126 U/L 25(L)  AST 15 - 41 U/L 31  ALT 14 - 54 U/L 27    Discharge instruction: per After Visit Summary and "Baby and Me Booklet".  After visit meds:  Allergies as of 10/29/2018   No Known Allergies     Medication List    TAKE these medications   ibuprofen 600  MG tablet Commonly known as: ADVIL Take 1 tablet (600 mg total) by mouth every 6 (six) hours as needed.   prenatal multivitamin Tabs tablet Take 1 tablet by mouth daily at 12 noon.   topiramate 50 MG tablet Commonly known as: Topamax Take 1 tablet (50 mg total) by mouth 2 (two) times daily.       Diet: routine diet  Activity: Advance as tolerated. Pelvic rest for 6 weeks.   Outpatient follow up:6 weeks Follow up Appt:No future appointments. Follow up Visit:No follow-ups on file.  Postpartum contraception: Undecided  Newborn Data: Live born female  Birth Weight: 7 lb 1.4 oz (3215 g) APGAR: 8, 9  Newborn Delivery   Birth date/time: 10/28/2018 16:50:00 Delivery type: Vaginal, Spontaneous      Baby Feeding: Breast Disposition:home with mother   10/29/2018 Janyth Contes, MD

## 2019-06-25 ENCOUNTER — Other Ambulatory Visit: Payer: Medicaid Other

## 2019-06-25 ENCOUNTER — Ambulatory Visit: Payer: Medicaid Other | Attending: Internal Medicine

## 2019-06-25 DIAGNOSIS — Z20822 Contact with and (suspected) exposure to covid-19: Secondary | ICD-10-CM

## 2019-06-26 LAB — SARS-COV-2, NAA 2 DAY TAT

## 2019-06-26 LAB — NOVEL CORONAVIRUS, NAA: SARS-CoV-2, NAA: NOT DETECTED

## 2019-09-05 IMAGING — US US OB TRANSVAGINAL
1 series · 15 of 28 positions shown · non-contrast
Comparison: None for this gestation

CLINICAL DATA: Cramping affecting pregnancy, uncertain LMP,
bleeding and cramping since [REDACTED]

EXAM:
OBSTETRIC <14 WK US AND TRANSVAGINAL OB US
TECHNIQUE: Both transabdominal and transvaginal ultrasound examinations were
performed for complete evaluation of the gestation as well as the
maternal uterus, adnexal regions, and pelvic cul-de-sac.
Transvaginal technique was performed to assess early pregnancy.

[Series 1: us ob transvaginal · 100 acquisitions, 15 frames shown]
[im 1/100]
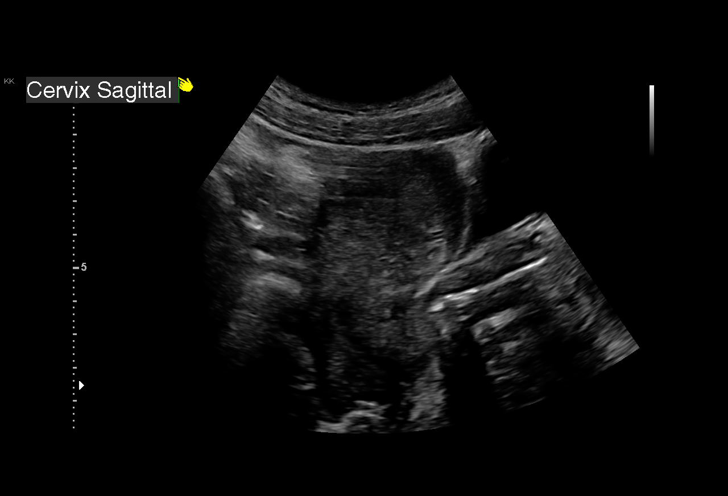
[im 8/100]
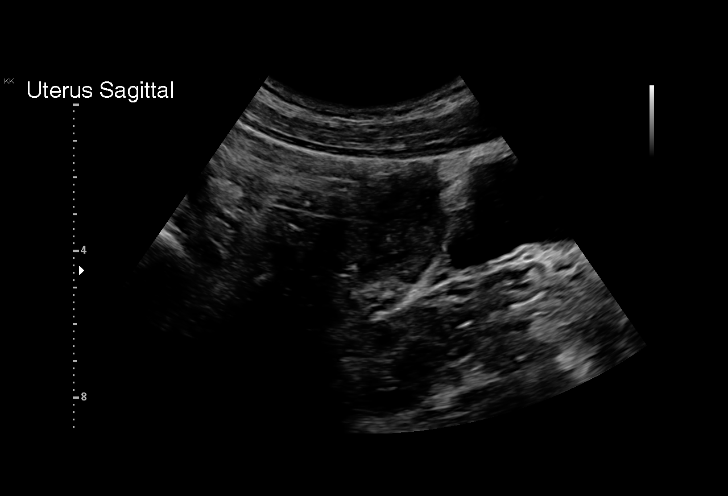
[im 15/100]
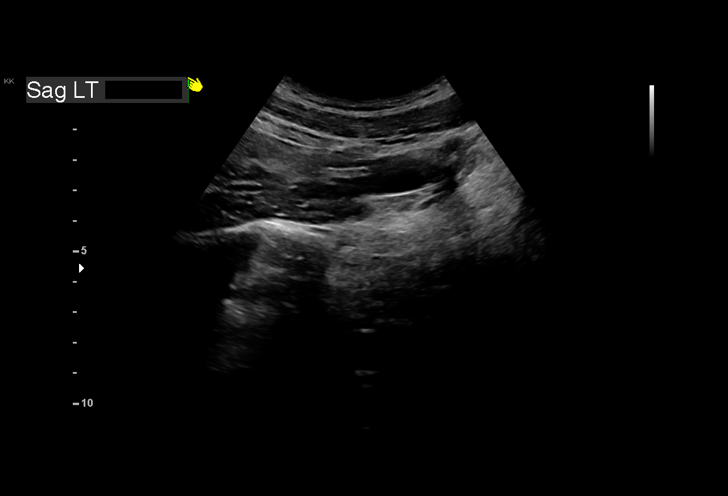
[im 23/100]
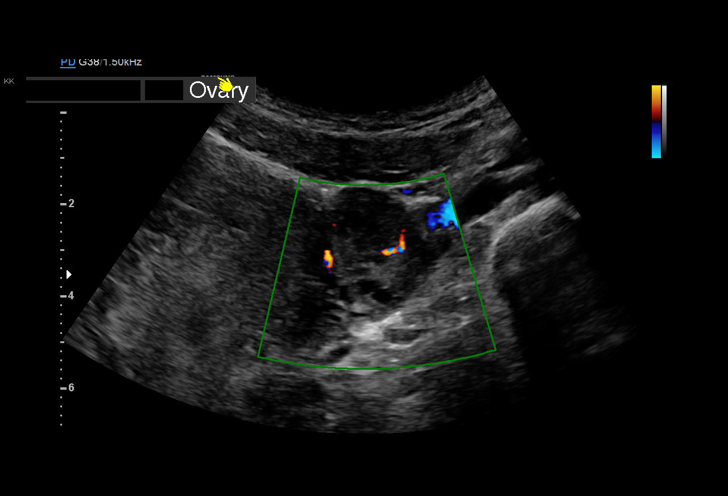
[im 30/100]
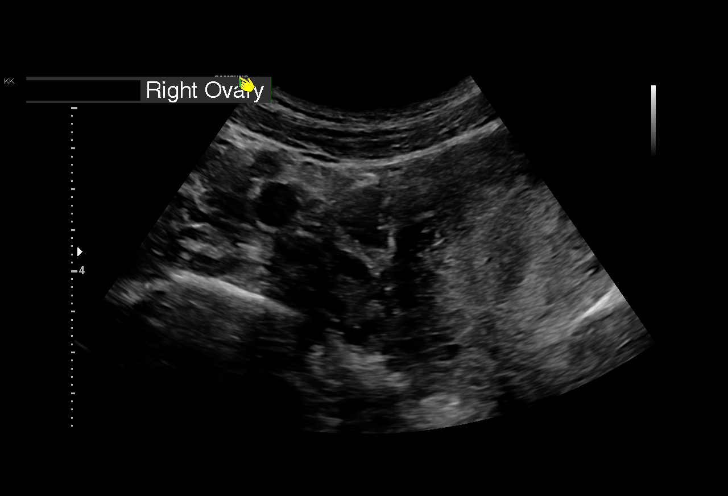
[im 37/100]
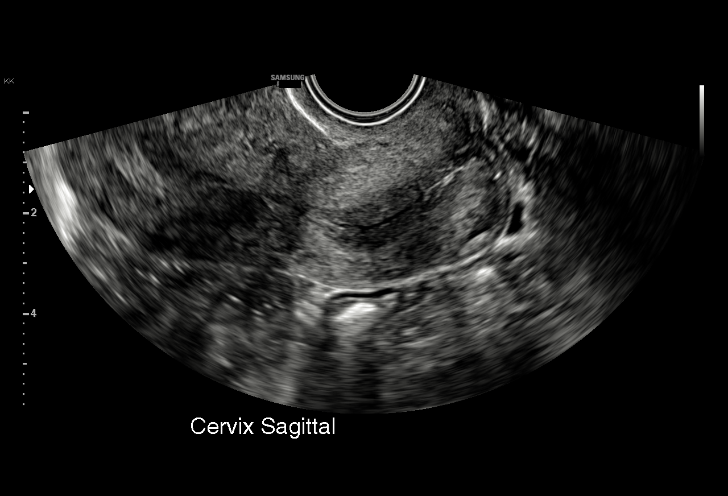
[im 45/100]
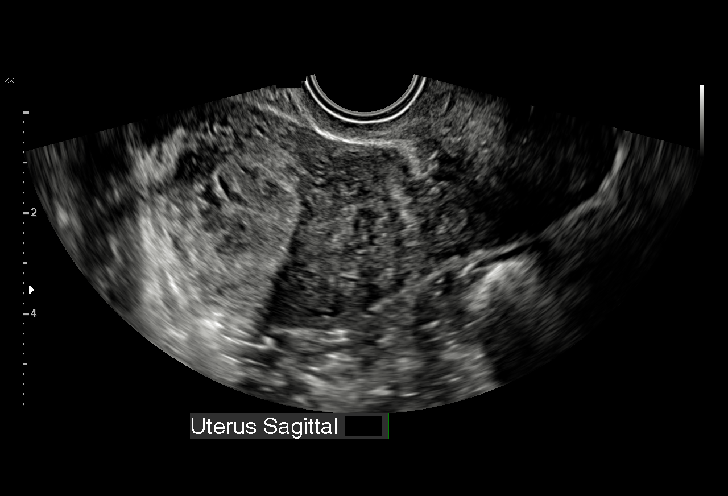
[im 52/100]
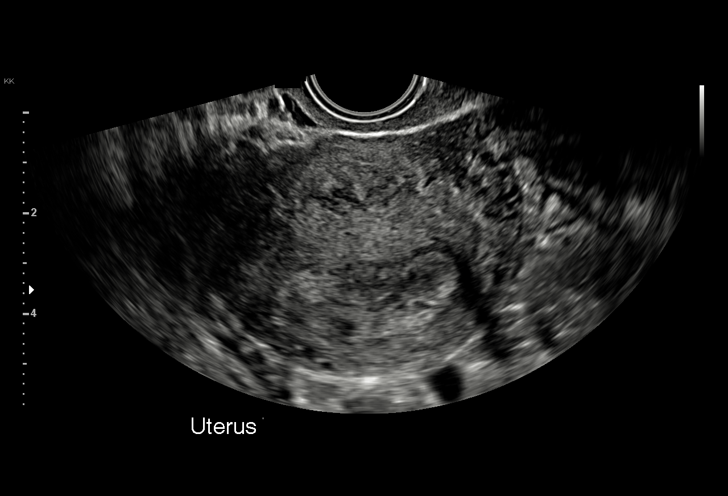
[im 56/100]
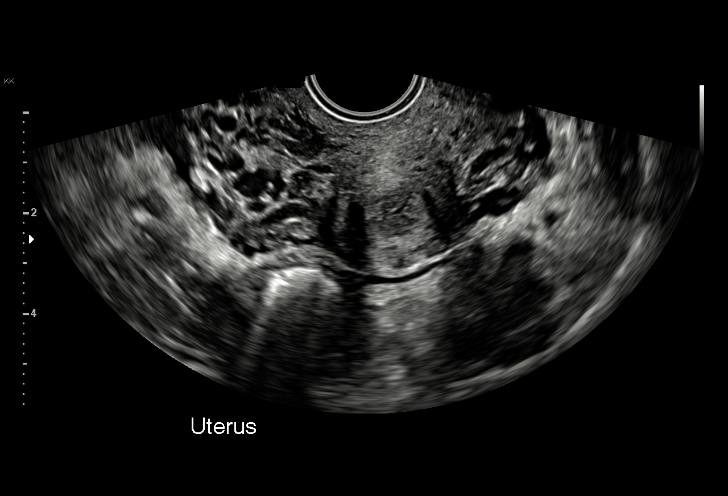
[im 63/100]
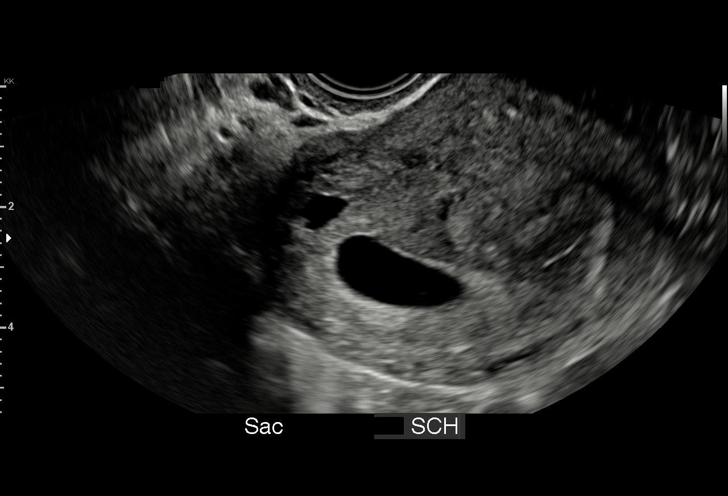
[im 70/100]
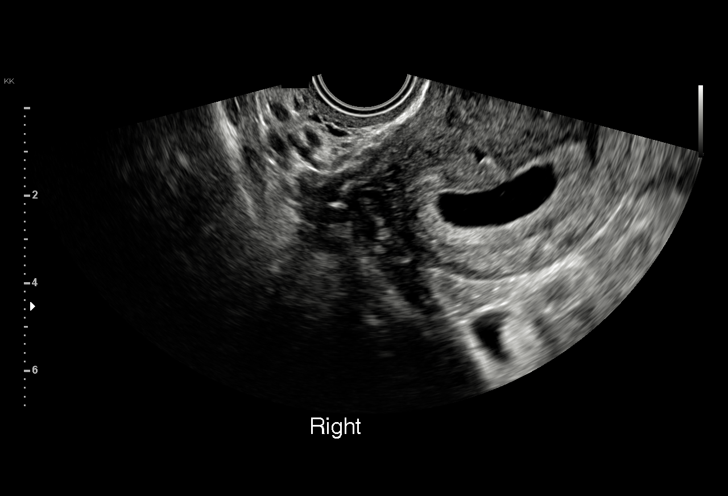
[im 78/100]
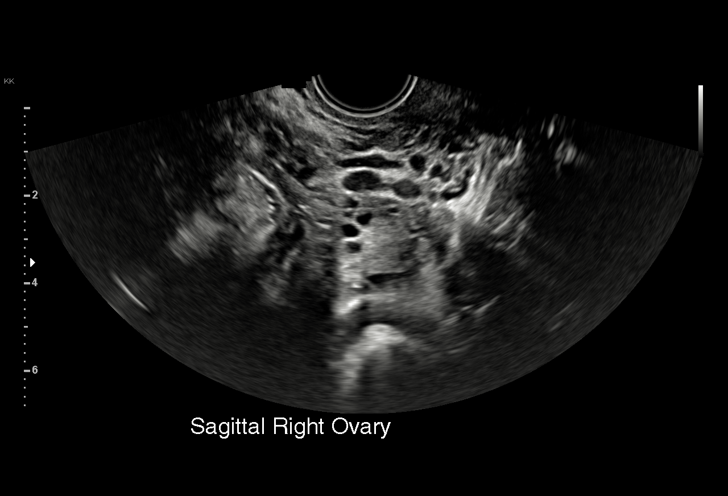
[im 85/100]
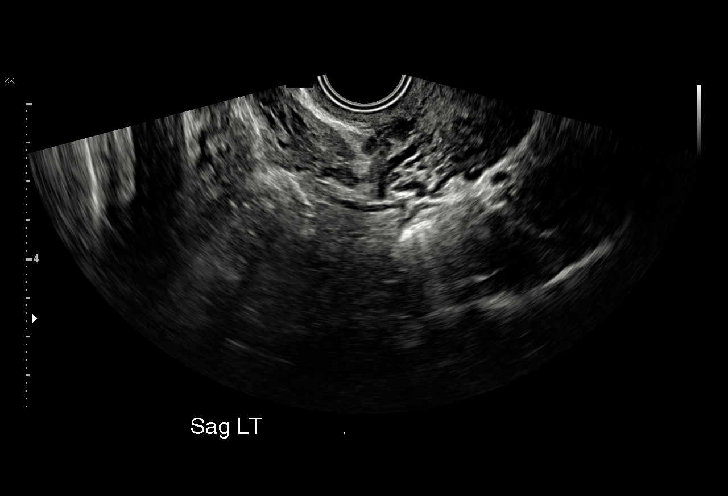
[im 92/100]
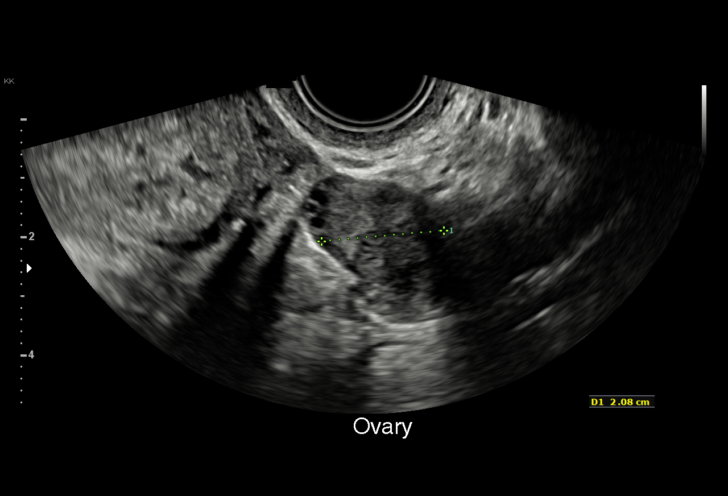
[im 100/100]
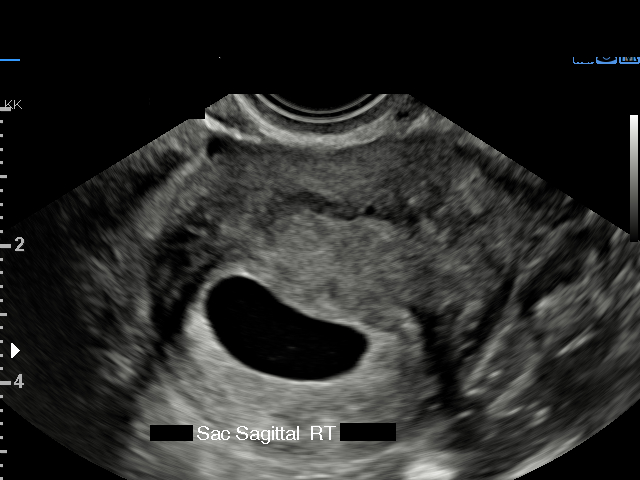

[15 of 28 positions shown; findings below may reference images not displayed]

FINDINGS: Intrauterine gestational sac: Present

Yolk sac:  Absent

Embryo:  Absent

Cardiac Activity: N/A

Heart Rate: N/A  bpm

MSD: 20.7 mm   6 w   6 d

Subchorionic hemorrhage:  Small subchronic hemorrhage

Maternal uterus/adnexae:

RIGHT ovary normal size and morphology, 1.5 x 2.5 x 2.6 cm.

LEFT ovary normal size and morphology, 2.1 x 3.6 x 2.0 cm,
containing a small corpus luteum.

Trace free pelvic fluid.

No adnexal masses.
IMPRESSION: Gestational sac identified within the uterus without visualization
of a yolk sac or fetal pole.

Small subchronic hemorrhage.

Findings are suspicious but not yet definitive for failed pregnancy.
Recommend follow-up US in 10-14 days for definitive diagnosis. This
recommendation follows SRU consensus guidelines: Diagnostic Criteria
for Nonviable Pregnancy Early in the First Trimester. N Engl J Med

## 2019-11-28 ENCOUNTER — Other Ambulatory Visit: Payer: Self-pay

## 2023-01-29 ENCOUNTER — Ambulatory Visit
Admission: EM | Admit: 2023-01-29 | Discharge: 2023-01-29 | Disposition: A | Discharge status: Home / Self Care | Payer: Medicaid Other | Attending: Emergency Medicine | Admitting: Emergency Medicine

## 2023-01-29 DIAGNOSIS — Z23 Encounter for immunization: Secondary | ICD-10-CM | POA: Diagnosis not present

## 2023-01-29 DIAGNOSIS — S61212A Laceration without foreign body of right middle finger without damage to nail, initial encounter: Secondary | ICD-10-CM | POA: Diagnosis not present

## 2023-01-29 MED ORDER — TETANUS-DIPHTH-ACELL PERTUSSIS 5-2.5-18.5 LF-MCG/0.5 IM SUSY
0.5000 mL | PREFILLED_SYRINGE | Freq: Once | INTRAMUSCULAR | Status: AC
  Administered 2023-01-29: 0.5 mL via INTRAMUSCULAR

## 2023-01-29 NOTE — Discharge Instructions (Signed)
Wound sutured today. Keep on dressing for about 24 hours to keep totally dry. After this, you may get the finger wet but do not soak it. Continue to keep dry, clean, and covered. Tetanus shot updated today. Take Tylenol and/or ibuprofen as needed for discomfort.  Return to care in about 10 days for suture removal. Return earlier if develop increasing pain, redness, swelling, or purulent discharge from the wound.

## 2023-01-29 NOTE — ED Triage Notes (Signed)
Pt reports cutting her rt third finger last night around 9pm with a knife. Pt states it is still bleeding a little.   Last tdap shot unknown per pt and currently not on blood thinners.

## 2023-01-29 NOTE — ED Provider Notes (Signed)
[facility]    CSN: [id number] Arrival date & time: 01/29/23  0911      History   Chief Complaint Chief Complaint  Patient presents with   Laceration    HPI Alexa [name] is a 32 y.o. female.   Patient presents with laceration to her right middle finger. She states last night, around 9pm, she was washing a knife and accidentally cut herself. She states it is still bleeding some. The patient denies numbness/tingling or weakness. She cleaned the area with alcohol and then wrapped it until presenting this morning. She is unsure when her last tetanus shot was. The patient is right-handed.  The history is provided by the patient.  Laceration   Past Medical History:  Diagnosis Date   Headache(784.0)    migraines, 2-3 times a month   Medical history non-contributory    SVD (spontaneous vaginal delivery) 10/28/2018    Patient Active Problem List   Diagnosis Date Noted   Term pregnancy 10/28/2018   SVD (spontaneous vaginal delivery) 10/28/2018   Missed abortion 09/21/2017   Status post dilation and curettage 09/21/2017   Migraine with aura 05/25/2012   Family history of Down syndrome 10/25/2010   CHEST PAIN UNSPECIFIED 12/22/2009    Past Surgical History:  Procedure Laterality Date   DILATION AND EVACUATION N/A 09/21/2017   Procedure: DILATATION AND EVACUATION;  Surgeon: Edwinna Areola, DO;  Location: [facility];  Service: Gynecology;  Laterality: N/A;   fractured arm     anesthesia to set and cast, left arm, 32 years old   MASS EXCISION     6 yeares old, neck   NO PAST SURGERIES      OB History     Gravida  4   Para  2   Term  2   Preterm  0   AB  1   Living  2      SAB  0   IAB  1   Ectopic  0   Multiple  0   Live Births  2            Home Medications    Prior to Admission medications   Medication Sig Start Date End Date Taking? Authorizing Provider  ibuprofen (ADVIL) 600 MG tablet Take 1 tablet (600 mg total)  by mouth every 6 (six) hours as needed. 10/29/18   Bovard-Stuckert, Augusto Gamble, MD  Prenatal Vit-Fe Fumarate-FA (PRENATAL MULTIVITAMIN) TABS tablet Take 1 tablet by mouth daily at 12 noon. 10/29/18   Bovard-Stuckert, Augusto Gamble, MD  topiramate (TOPAMAX) 50 MG tablet Take 1 tablet (50 mg total) by mouth 2 (two) times daily. 02/20/18   Elvina Sidle, MD    Family History Family History  Problem Relation Age of Onset   Hypertension Father    Heart disease Maternal Grandmother    Hypertension Maternal Grandfather    Anesthesia problems Neg Hx     Social History Social History   Tobacco Use   Smoking status: Former    Types: Cigarettes   Smokeless tobacco: Never   Tobacco comments:    quit about 4-5 weeks ago   Vaping Use   Vaping status: Never Used  Substance Use Topics   Alcohol use: Not Currently   Drug use: No     Allergies   Patient has no active allergies.   Review of Systems Review of Systems  Musculoskeletal:  Negative for arthralgias and joint swelling.  Skin:  Positive for wound.  Neurological:  Negative for weakness and  numbness.     Physical Exam Triage Vital Signs ED Triage Vitals  Encounter Vitals Group     BP 01/29/23 0946 107/69     Systolic BP Percentile --      Diastolic BP Percentile --      Pulse Rate 01/29/23 0946 79     Resp 01/29/23 0946 17     Temp 01/29/23 0946 98.2 F (36.8 C)     Temp Source 01/29/23 0946 Oral     SpO2 01/29/23 0946 96 %     Weight --      Height --      Head Circumference --      Peak Flow --      Pain Score 01/29/23 0945 0     Pain Loc --      Pain Education --      Exclude from Growth Chart --    No data found.  Updated Vital Signs BP 107/69 (BP Location: Left Arm)   Pulse 79   Temp 98.2 F (36.8 C) (Oral)   Resp 17   LMP 01/26/2023 (Exact Date)   SpO2 96%   Visual Acuity Right Eye Distance:   Left Eye Distance:   Bilateral Distance:    Right Eye Near:   Left Eye Near:    Bilateral Near:     Physical  Exam Vitals and nursing note reviewed.  Constitutional:      General: She is not in acute distress. Cardiovascular:     Pulses: Normal pulses.  Pulmonary:     Effort: Pulmonary effort is normal.  Skin:    Comments: 1.5cm arcuate flap laceration to distal palmar right 3rd digit. Exposed subcutaneous tissue. Bleeding controlled with pressure, slight bleeding without pressure. Sensation and ROM intact.   Neurological:     Mental Status: She is alert.  Psychiatric:        Mood and Affect: Mood normal.      UC Treatments / Results  Labs (all labs ordered are listed, but only abnormal results are displayed) Labs Reviewed - No data to display  EKG   Radiology No results found.  Procedures Laceration Repair  Date/Time: 01/29/2023 10:41 AM  Performed by: Estanislado Pandy, PA Authorized by: Estanislado Pandy, PA   Consent:    Consent obtained:  Verbal   Consent given by:  Patient   Risks, benefits, and alternatives were discussed: yes     Risks discussed:  Infection, pain and poor wound healing Anesthesia:    Anesthesia method:  Nerve block   Block needle gauge:  27 G   Block anesthetic:  Lidocaine 1% w/o epi   Block technique:  Digital block   Block injection procedure:  Anatomic landmarks identified, introduced needle, incremental injection and negative aspiration for blood   Block outcome:  Anesthesia achieved Laceration details:    Location:  Finger   Finger location:  R long finger   Length (cm):  1.5   Depth (mm):  1 Pre-procedure details:    Preparation:  Patient was prepped and draped in usual sterile fashion Exploration:    Limited defect created (wound extended): no     Hemostasis achieved with:  Direct pressure   Wound exploration: entire depth of wound visualized     Contaminated: no   Treatment:    Area cleansed with:  Chlorhexidine   Amount of cleaning:  Standard Skin repair:    Repair method:  Sutures   Suture size:  5-0   Suture  material:  Prolene    Suture technique:  Simple interrupted   Number of sutures:  5 Approximation:    Approximation:  Close Repair type:    Repair type:  Simple Post-procedure details:    Dressing:  Non-adherent dressing   Procedure completion:  Tolerated well, no immediate complications  (including critical care time)  Medications Ordered in UC Medications  Tdap (BOOSTRIX) injection 0.5 mL (has no administration in time range)    Initial Impression / Assessment and Plan / UC Course  I have reviewed the triage vital signs and the nursing notes.  Pertinent labs & imaging results that were available during my care of the patient were reviewed by me and considered in my medical decision making (see chart for details).     Laceration repaired with sutures. Tetanus updated as pt unsure of last vaccination. Discussed wound care and return for suture removal. Discussed return precautions.   E/M: 1 acute uncomplicated illness, no data, moderate risk due to procedure  Final Clinical Impressions(s) / UC Diagnoses   Final diagnoses:  Laceration of right middle finger without foreign body without damage to nail, initial encounter     Discharge Instructions      Wound sutured today. Keep on dressing for about 24 hours to keep totally dry. After this, you may get the finger wet but do not soak it. Continue to keep dry, clean, and covered. Tetanus shot updated today. Take Tylenol and/or ibuprofen as needed for discomfort.  Return to care in about 10 days for suture removal. Return earlier if develop increasing pain, redness, swelling, or purulent discharge from the wound.     ED Prescriptions   None    PDMP not reviewed this encounter.   Estanislado Pandy, [place] 01/29/23 1044

## 2023-02-08 ENCOUNTER — Ambulatory Visit: Payer: Medicaid Other

## 2023-02-11 ENCOUNTER — Ambulatory Visit
Admission: RE | Admit: 2023-02-11 | Discharge: 2023-02-11 | Disposition: A | Discharge status: Home / Self Care | Payer: Medicaid Other | Source: Ambulatory Visit

## 2023-02-11 NOTE — ED Triage Notes (Signed)
Pt presents for suture removal in right middle finger

## 2023-04-23 ENCOUNTER — Ambulatory Visit
Admission: RE | Admit: 2023-04-23 | Discharge: 2023-04-23 | Disposition: A | Discharge status: Home / Self Care | Payer: Managed Care, Other (non HMO) | Source: Ambulatory Visit | Attending: Emergency Medicine | Admitting: Emergency Medicine

## 2023-04-23 VITALS — BP 96/63 | HR 76 | Temp 100.2°F | Resp 17

## 2023-04-23 DIAGNOSIS — B349 Viral infection, unspecified: Secondary | ICD-10-CM

## 2023-04-23 DIAGNOSIS — U071 COVID-19: Secondary | ICD-10-CM

## 2023-04-23 LAB — POC COVID19/FLU A&B COMBO
Covid Antigen, POC: POSITIVE — AB
Influenza A Antigen, POC: NEGATIVE
Influenza B Antigen, POC: NEGATIVE

## 2023-04-23 MED ORDER — PROMETHAZINE-DM 6.25-15 MG/5ML PO SYRP: 5.0000 mL | ORAL_SOLUTION | Freq: Four times a day (QID) | ORAL | 0 refills | Status: DC | PRN

## 2023-04-23 MED ORDER — CYCLOBENZAPRINE HCL 10 MG PO TABS: 10.0000 mg | ORAL_TABLET | Freq: Two times a day (BID) | ORAL | 0 refills | Status: DC | PRN

## 2023-04-23 MED ORDER — CETIRIZINE HCL 10 MG PO TABS: 10.0000 mg | ORAL_TABLET | Freq: Every day | ORAL | 2 refills | Status: DC

## 2023-04-23 MED ORDER — IBUPROFEN 600 MG PO TABS: 600.0000 mg | ORAL_TABLET | Freq: Four times a day (QID) | ORAL | 0 refills | Status: DC | PRN

## 2023-04-23 NOTE — ED Provider Notes (Signed)
[facility]    CSN: [id number] Arrival date & time: 04/23/23  1749     History   Chief Complaint Chief Complaint  Patient presents with   Nasal Congestion    Sick - Entered by patient    HPI Alexa [name] is a 33 y.o. female.  Here with 2 day history of sore throat, runny nose, body aches, and chills. Also having some cough Has used tylenol, last dose > 8 hours ago No fever until tonight Denies abd pain, NVD Possible sick contacts; son was sick recently  No recent travel   Past Medical History:  Diagnosis Date   Headache(784.0)    migraines, 2-3 times a month   Medical history non-contributory    SVD (spontaneous vaginal delivery) 10/28/2018    Patient Active Problem List   Diagnosis Date Noted   Term pregnancy 10/28/2018   SVD (spontaneous vaginal delivery) 10/28/2018   Missed abortion 09/21/2017   Status post dilation and curettage 09/21/2017   Migraine with aura 05/25/2012   Family history of Down syndrome 10/25/2010   CHEST PAIN UNSPECIFIED 12/22/2009    Past Surgical History:  Procedure Laterality Date   DILATION AND EVACUATION N/A 09/21/2017   Procedure: DILATATION AND EVACUATION;  Surgeon: Edwinna Areola, DO;  Location: [facility];  Service: Gynecology;  Laterality: N/A;   fractured arm     anesthesia to set and cast, left arm, 34 years old   MASS EXCISION     6 yeares old, neck   NO PAST SURGERIES      OB History     Gravida  4   Para  2   Term  2   Preterm  0   AB  1   Living  2      SAB  0   IAB  1   Ectopic  0   Multiple  0   Live Births  2            Home Medications    Prior to Admission medications   Medication Sig Start Date End Date Taking? Authorizing Provider  cetirizine (ZYRTEC ALLERGY) 10 MG tablet Take 1 tablet (10 mg total) by mouth daily. 04/23/23  Yes Jeanne Terrance, Lurena Joiner, PA-C  cyclobenzaprine (FLEXERIL) 10 MG tablet Take 1 tablet (10 mg total) by mouth 2 (two) times daily as needed  for muscle spasms. 04/23/23  Yes Densel Kronick, Lurena Joiner, PA-C  ibuprofen (ADVIL) 600 MG tablet Take 1 tablet (600 mg total) by mouth every 6 (six) hours as needed. 04/23/23  Yes Brevan Luberto, Lurena Joiner, PA-C  promethazine-dextromethorphan (PROMETHAZINE-DM) 6.25-15 MG/5ML syrup Take 5 mLs by mouth 4 (four) times daily as needed for cough. 04/23/23  Yes Keena Dinse, Lurena Joiner, PA-C  Prenatal Vit-Fe Fumarate-FA (PRENATAL MULTIVITAMIN) TABS tablet Take 1 tablet by mouth daily at 12 noon. 10/29/18   Bovard-Stuckert, Augusto Gamble, MD  topiramate (TOPAMAX) 50 MG tablet Take 1 tablet (50 mg total) by mouth 2 (two) times daily. 02/20/18   Elvina Sidle, MD    Family History Family History  Problem Relation Age of Onset   Hypertension Father    Heart disease Maternal Grandmother    Hypertension Maternal Grandfather    Anesthesia problems Neg Hx     Social History Social History   Tobacco Use   Smoking status: Former    Types: Cigarettes   Smokeless tobacco: Never   Tobacco comments:    quit about 4-5 weeks ago   Vaping Use   Vaping status: Never Used  Substance  Use Topics   Alcohol use: Not Currently   Drug use: No     Allergies   Patient has no known allergies.   Review of Systems Review of Systems Per HPI  Physical Exam Triage Vital Signs ED Triage Vitals  Encounter Vitals Group     BP 04/23/23 1822 96/63     Systolic BP Percentile --      Diastolic BP Percentile --      Pulse Rate 04/23/23 1820 76     Resp 04/23/23 1820 17     Temp 04/23/23 1822 100.2 F (37.9 C)     Temp Source 04/23/23 1820 Oral     SpO2 04/23/23 1820 98 %     Weight --      Height --      Head Circumference --      Peak Flow --      Pain Score 04/23/23 1819 3     Pain Loc --      Pain Education --      Exclude from Growth Chart --    No data found.  Updated Vital Signs BP 96/63 (BP Location: Left Arm)   Pulse 76   Temp 100.2 F (37.9 C) (Oral)   Resp 17   LMP 04/17/2023 (Exact Date)   SpO2 98%    Physical  Exam Vitals and nursing note reviewed.  Constitutional:      General: She is not in acute distress. HENT:     Right Ear: Tympanic membrane and ear canal normal.     Left Ear: Tympanic membrane and ear canal normal.     Nose: Rhinorrhea present.     Mouth/Throat:     Mouth: Mucous membranes are moist.     Pharynx: Oropharynx is clear. No posterior oropharyngeal erythema.  Eyes:     Conjunctiva/sclera: Conjunctivae normal.  Cardiovascular:     Rate and Rhythm: Normal rate and regular rhythm.     Pulses: Normal pulses.     Heart sounds: Normal heart sounds.  Pulmonary:     Effort: Pulmonary effort is normal.     Breath sounds: Normal breath sounds.  Abdominal:     Palpations: Abdomen is soft.     Tenderness: There is no abdominal tenderness.  Musculoskeletal:        General: Normal range of motion.     Cervical back: Normal range of motion.  Lymphadenopathy:     Cervical: No cervical adenopathy.  Skin:    General: Skin is warm and dry.  Neurological:     Mental Status: She is alert and oriented to person, place, and time.     UC Treatments / Results  Labs (all labs ordered are listed, but only abnormal results are displayed) Labs Reviewed  POC COVID19/FLU A&B COMBO - Abnormal; Notable for the following components:      Result Value   Covid Antigen, POC Positive (*)    All other components within normal limits    EKG  Radiology No results found.  Procedures Procedures  Medications Ordered in UC Medications - No data to display  Initial Impression / Assessment and Plan / UC Course  I have reviewed the triage vital signs and the nursing notes.  Pertinent labs & imaging results that were available during my care of the patient were reviewed by me and considered in my medical decision making (see chart for details).  Temp 100.2 on arrival Rapid covid is positive Flu negative  Advised symptomatic care, OTC  medications and home remedies Sent flexeril and ibu for  body aches, fever. Promethazine DM for cough. Discussed virus prognosis. Can return with any concerns. Work note provided  Final Clinical Impressions(s) / UC Diagnoses   Final diagnoses:  Viral illness  COVID     Discharge Instructions      You can take the muscle relaxer Flexeril twice daily. If the medication makes you drowsy, take only at bed time.  Ibuprofen and tylenol can be alternated for fever, aches, headache, etc  The promethazine DM cough syrup can be used up to 4 times daily. If this medication makes you drowsy, take only once before bed.  Daily zyrtec and nightly benadryl can help with runny nose  Make sure you are drinking lots of fluids!  Unfortunately you may have 4-5 more days of symptoms before noticing improvement     ED Prescriptions     Medication Sig Dispense Auth. Provider   cyclobenzaprine (FLEXERIL) 10 MG tablet Take 1 tablet (10 mg total) by mouth 2 (two) times daily as needed for muscle spasms. 20 tablet Avryl Roehm, PA-C   ibuprofen (ADVIL) 600 MG tablet Take 1 tablet (600 mg total) by mouth every 6 (six) hours as needed. 30 tablet Lexus Shampine, PA-C   promethazine-dextromethorphan (PROMETHAZINE-DM) 6.25-15 MG/5ML syrup Take 5 mLs by mouth 4 (four) times daily as needed for cough. 240 mL Latoi Giraldo, PA-C   cetirizine (ZYRTEC ALLERGY) 10 MG tablet Take 1 tablet (10 mg total) by mouth daily. 30 tablet Lekia Nier, Lurena Joiner, PA-C      PDMP not reviewed this encounter.   Kathrine Haddock 04/23/23 1901

## 2023-04-23 NOTE — Discharge Instructions (Addendum)
You can take the muscle relaxer Flexeril twice daily. If the medication makes you drowsy, take only at bed time.  Ibuprofen and tylenol can be alternated for fever, aches, headache, etc  The promethazine DM cough syrup can be used up to 4 times daily. If this medication makes you drowsy, take only once before bed.  Daily zyrtec and nightly benadryl can help with runny nose  Make sure you are drinking lots of fluids!  Unfortunately you may have 4-5 more days of symptoms before noticing improvement

## 2023-04-23 NOTE — ED Triage Notes (Signed)
Pt presents with c/o nasal congestion, chills, body aches, sore throat X 2 days.   Pt has taken tylenol.

## 2023-07-13 ENCOUNTER — Inpatient Hospital Stay (HOSPITAL_COMMUNITY)
Admission: EM | Admit: 2023-07-13 | Discharge: 2023-07-15 | DRG: 552 | Disposition: A | Discharge status: Home / Self Care | Attending: Surgery | Admitting: Surgery

## 2023-07-13 ENCOUNTER — Emergency Department (HOSPITAL_COMMUNITY)

## 2023-07-13 ENCOUNTER — Other Ambulatory Visit: Payer: Self-pay

## 2023-07-13 ENCOUNTER — Encounter (HOSPITAL_COMMUNITY): Payer: Self-pay

## 2023-07-13 DIAGNOSIS — R109 Unspecified abdominal pain: Secondary | ICD-10-CM | POA: Diagnosis present

## 2023-07-13 DIAGNOSIS — S80211A Abrasion, right knee, initial encounter: Secondary | ICD-10-CM | POA: Diagnosis present

## 2023-07-13 DIAGNOSIS — S300XXA Contusion of lower back and pelvis, initial encounter: Secondary | ICD-10-CM | POA: Diagnosis present

## 2023-07-13 DIAGNOSIS — S80212A Abrasion, left knee, initial encounter: Secondary | ICD-10-CM | POA: Diagnosis present

## 2023-07-13 DIAGNOSIS — M25552 Pain in left hip: Secondary | ICD-10-CM | POA: Diagnosis present

## 2023-07-13 DIAGNOSIS — W2211XA Striking against or struck by driver side automobile airbag, initial encounter: Secondary | ICD-10-CM

## 2023-07-13 DIAGNOSIS — S32019A Unspecified fracture of first lumbar vertebra, initial encounter for closed fracture: Secondary | ICD-10-CM | POA: Diagnosis not present

## 2023-07-13 DIAGNOSIS — T148XXA Other injury of unspecified body region, initial encounter: Secondary | ICD-10-CM | POA: Diagnosis not present

## 2023-07-13 DIAGNOSIS — M25561 Pain in right knee: Secondary | ICD-10-CM | POA: Diagnosis present

## 2023-07-13 DIAGNOSIS — S32009A Unspecified fracture of unspecified lumbar vertebra, initial encounter for closed fracture: Secondary | ICD-10-CM | POA: Diagnosis present

## 2023-07-13 DIAGNOSIS — Z87891 Personal history of nicotine dependence: Secondary | ICD-10-CM

## 2023-07-13 DIAGNOSIS — S0083XA Contusion of other part of head, initial encounter: Secondary | ICD-10-CM | POA: Diagnosis present

## 2023-07-13 DIAGNOSIS — Z79899 Other long term (current) drug therapy: Secondary | ICD-10-CM

## 2023-07-13 DIAGNOSIS — Z975 Presence of (intrauterine) contraceptive device: Secondary | ICD-10-CM

## 2023-07-13 DIAGNOSIS — M25571 Pain in right ankle and joints of right foot: Secondary | ICD-10-CM | POA: Diagnosis present

## 2023-07-13 DIAGNOSIS — Z8249 Family history of ischemic heart disease and other diseases of the circulatory system: Secondary | ICD-10-CM

## 2023-07-13 DIAGNOSIS — S32029A Unspecified fracture of second lumbar vertebra, initial encounter for closed fracture: Secondary | ICD-10-CM | POA: Diagnosis present

## 2023-07-13 DIAGNOSIS — Y92410 Unspecified street and highway as the place of occurrence of the external cause: Secondary | ICD-10-CM

## 2023-07-13 LAB — URINALYSIS, ROUTINE W REFLEX MICROSCOPIC
Bilirubin Urine: NEGATIVE
Glucose, UA: NEGATIVE mg/dL
Hgb urine dipstick: NEGATIVE
Ketones, ur: NEGATIVE mg/dL
Leukocytes,Ua: NEGATIVE
Nitrite: NEGATIVE
Protein, ur: NEGATIVE mg/dL
Specific Gravity, Urine: 1.013 (ref 1.005–1.030)
pH: 7 (ref 5.0–8.0)

## 2023-07-13 LAB — COMPREHENSIVE METABOLIC PANEL WITH GFR
ALT: 40 U/L (ref 0–44)
AST: 64 U/L — ABNORMAL HIGH (ref 15–41)
Albumin: 3.8 g/dL (ref 3.5–5.0)
Alkaline Phosphatase: 26 U/L — ABNORMAL LOW (ref 38–126)
Anion gap: 11 (ref 5–15)
BUN: 9 mg/dL (ref 6–20)
CO2: 20 mmol/L — ABNORMAL LOW (ref 22–32)
Calcium: 8.8 mg/dL — ABNORMAL LOW (ref 8.9–10.3)
Chloride: 107 mmol/L (ref 98–111)
Creatinine, Ser: 0.88 mg/dL (ref 0.44–1.00)
GFR, Estimated: >60 mL/min (ref >60)
Glucose, Bld: 111 mg/dL — ABNORMAL HIGH (ref 70–99)
Potassium: 3.3 mmol/L — ABNORMAL LOW (ref 3.5–5.1)
Sodium: 138 mmol/L (ref 135–145)
Total Bilirubin: 0.9 mg/dL (ref 0.0–1.2)
Total Protein: 6.8 g/dL (ref 6.5–8.1)

## 2023-07-13 LAB — PROTIME-INR
INR: 1.1 (ref 0.8–1.2)
Prothrombin Time: 14.5 s (ref 11.4–15.2)

## 2023-07-13 LAB — CBC
HCT: 39.6 % (ref 36.0–46.0)
Hemoglobin: 12.7 g/dL (ref 12.0–15.0)
MCH: 30 pg (ref 26.0–34.0)
MCHC: 32.1 g/dL (ref 30.0–36.0)
MCV: 93.6 fL (ref 80.0–100.0)
Platelets: 234 10*3/uL (ref 150–400)
RBC: 4.23 MIL/uL (ref 3.87–5.11)
RDW: 13.7 % (ref 11.5–15.5)
WBC: 11.8 10*3/uL — ABNORMAL HIGH (ref 4.0–10.5)
nRBC: 0 % (ref 0.0–0.2)

## 2023-07-13 LAB — SAMPLE TO BLOOD BANK

## 2023-07-13 LAB — I-STAT CHEM 8, ED
BUN: 10 mg/dL (ref 6–20)
Calcium, Ion: 1.08 mmol/L — ABNORMAL LOW (ref 1.15–1.40)
Chloride: 107 mmol/L (ref 98–111)
Creatinine, Ser: 0.9 mg/dL (ref 0.44–1.00)
Glucose, Bld: 106 mg/dL — ABNORMAL HIGH (ref 70–99)
HCT: 38 % (ref 36.0–46.0)
Hemoglobin: 12.9 g/dL (ref 12.0–15.0)
Potassium: 3.2 mmol/L — ABNORMAL LOW (ref 3.5–5.1)
Sodium: 138 mmol/L (ref 135–145)
TCO2: 22 mmol/L (ref 22–32)

## 2023-07-13 LAB — I-STAT CG4 LACTIC ACID, ED: Lactic Acid, Venous: 1.8 mmol/L (ref 0.5–1.9)

## 2023-07-13 LAB — HCG, SERUM, QUALITATIVE: Preg, Serum: NEGATIVE

## 2023-07-13 LAB — ETHANOL: Alcohol, Ethyl (B): <10 mg/dL (ref <10)

## 2023-07-13 MED ORDER — SODIUM CHLORIDE 0.9 % IV BOLUS
1000.0000 mL | Freq: Once | INTRAVENOUS | Status: AC
  Administered 2023-07-13: 1000 mL via INTRAVENOUS

## 2023-07-13 MED ORDER — ONDANSETRON HCL 4 MG/2ML IJ SOLN
4.0000 mg | Freq: Once | INTRAMUSCULAR | Status: AC
  Administered 2023-07-13: 4 mg via INTRAVENOUS
  Filled 2023-07-13: qty 2

## 2023-07-13 MED ORDER — IOHEXOL 350 MG/ML SOLN
75.0000 mL | Freq: Once | INTRAVENOUS | Status: AC | PRN
  Administered 2023-07-13: 75 mL via INTRAVENOUS

## 2023-07-13 MED ORDER — OXYCODONE HCL 5 MG PO TABS
5.0000 mg | ORAL_TABLET | Freq: Once | ORAL | Status: AC
  Administered 2023-07-13: 5 mg via ORAL
  Filled 2023-07-13: qty 1

## 2023-07-13 MED ORDER — FENTANYL CITRATE PF 50 MCG/ML IJ SOSY
50.0000 ug | PREFILLED_SYRINGE | INTRAMUSCULAR | Status: DC | PRN
  Administered 2023-07-13: 50 ug via INTRAVENOUS
  Filled 2023-07-13 (×2): qty 1

## 2023-07-13 MED ORDER — HYDROMORPHONE HCL 1 MG/ML IJ SOLN
0.5000 mg | Freq: Once | INTRAMUSCULAR | Status: AC
  Administered 2023-07-14: 0.5 mg via INTRAVENOUS
  Filled 2023-07-13: qty 1

## 2023-07-13 MED ORDER — KETOROLAC TROMETHAMINE 30 MG/ML IJ SOLN
30.0000 mg | Freq: Once | INTRAMUSCULAR | Status: DC
  Filled 2023-07-13: qty 1

## 2023-07-13 NOTE — ED Notes (Signed)
 The ortho tech contacted  about placing a back brace on the pt

## 2023-07-13 NOTE — ED Notes (Signed)
 The pt refused the fentanyl she wants another medicine

## 2023-07-13 NOTE — ED Notes (Signed)
 The pt demanded that the iv in her lt a-c be removed she reports that it is hurting

## 2023-07-13 NOTE — ED Notes (Signed)
 Pt c/o hip pain asking for more pain med

## 2023-07-13 NOTE — Progress Notes (Signed)
 Orthopedic Tech Progress Note Patient Details:  Alexa [name] [birth year] [id number]  Ortho Devices Type of Ortho Device: Lumbar corsett Ortho Device/Splint Interventions: Ordered, Adjustment   Post Interventions Instructions Provided: Care of device, Adjustment of device  [place] A Cartina Brousseau 07/13/2023, 11:22 PM

## 2023-07-14 ENCOUNTER — Encounter (HOSPITAL_COMMUNITY): Payer: Self-pay

## 2023-07-14 DIAGNOSIS — Z975 Presence of (intrauterine) contraceptive device: Secondary | ICD-10-CM | POA: Diagnosis not present

## 2023-07-14 DIAGNOSIS — Y92410 Unspecified street and highway as the place of occurrence of the external cause: Secondary | ICD-10-CM | POA: Diagnosis not present

## 2023-07-14 DIAGNOSIS — S80211A Abrasion, right knee, initial encounter: Secondary | ICD-10-CM | POA: Diagnosis present

## 2023-07-14 DIAGNOSIS — S300XXA Contusion of lower back and pelvis, initial encounter: Secondary | ICD-10-CM | POA: Diagnosis present

## 2023-07-14 DIAGNOSIS — S0083XA Contusion of other part of head, initial encounter: Secondary | ICD-10-CM | POA: Diagnosis present

## 2023-07-14 DIAGNOSIS — Z8249 Family history of ischemic heart disease and other diseases of the circulatory system: Secondary | ICD-10-CM | POA: Diagnosis not present

## 2023-07-14 DIAGNOSIS — M25561 Pain in right knee: Secondary | ICD-10-CM | POA: Diagnosis present

## 2023-07-14 DIAGNOSIS — M25552 Pain in left hip: Secondary | ICD-10-CM | POA: Diagnosis present

## 2023-07-14 DIAGNOSIS — S80212A Abrasion, left knee, initial encounter: Secondary | ICD-10-CM | POA: Diagnosis present

## 2023-07-14 DIAGNOSIS — S32029A Unspecified fracture of second lumbar vertebra, initial encounter for closed fracture: Secondary | ICD-10-CM | POA: Diagnosis present

## 2023-07-14 DIAGNOSIS — Z79899 Other long term (current) drug therapy: Secondary | ICD-10-CM | POA: Diagnosis not present

## 2023-07-14 DIAGNOSIS — S32019A Unspecified fracture of first lumbar vertebra, initial encounter for closed fracture: Secondary | ICD-10-CM | POA: Diagnosis present

## 2023-07-14 DIAGNOSIS — R109 Unspecified abdominal pain: Secondary | ICD-10-CM | POA: Diagnosis present

## 2023-07-14 DIAGNOSIS — T148XXA Other injury of unspecified body region, initial encounter: Secondary | ICD-10-CM | POA: Diagnosis present

## 2023-07-14 DIAGNOSIS — S32009A Unspecified fracture of unspecified lumbar vertebra, initial encounter for closed fracture: Secondary | ICD-10-CM | POA: Diagnosis present

## 2023-07-14 DIAGNOSIS — Z87891 Personal history of nicotine dependence: Secondary | ICD-10-CM | POA: Diagnosis not present

## 2023-07-14 DIAGNOSIS — M25571 Pain in right ankle and joints of right foot: Secondary | ICD-10-CM | POA: Diagnosis present

## 2023-07-14 DIAGNOSIS — W2211XA Striking against or struck by driver side automobile airbag, initial encounter: Secondary | ICD-10-CM | POA: Diagnosis not present

## 2023-07-14 LAB — CBC
HCT: 36.5 % (ref 36.0–46.0)
Hemoglobin: 12 g/dL (ref 12.0–15.0)
MCH: 29.9 pg (ref 26.0–34.0)
MCHC: 32.9 g/dL (ref 30.0–36.0)
MCV: 91 fL (ref 80.0–100.0)
Platelets: 220 10*3/uL (ref 150–400)
RBC: 4.01 MIL/uL (ref 3.87–5.11)
RDW: 13.6 % (ref 11.5–15.5)
WBC: 10.1 10*3/uL (ref 4.0–10.5)
nRBC: 0 % (ref 0.0–0.2)

## 2023-07-14 LAB — HIV ANTIBODY (ROUTINE TESTING W REFLEX): HIV Screen 4th Generation wRfx: NONREACTIVE

## 2023-07-14 LAB — BASIC METABOLIC PANEL WITH GFR
Anion gap: 11 (ref 5–15)
BUN: 6 mg/dL (ref 6–20)
CO2: 19 mmol/L — ABNORMAL LOW (ref 22–32)
Calcium: 8.2 mg/dL — ABNORMAL LOW (ref 8.9–10.3)
Chloride: 107 mmol/L (ref 98–111)
Creatinine, Ser: 0.85 mg/dL (ref 0.44–1.00)
GFR, Estimated: >60 mL/min (ref >60)
Glucose, Bld: 96 mg/dL (ref 70–99)
Potassium: 3.5 mmol/L (ref 3.5–5.1)
Sodium: 137 mmol/L (ref 135–145)

## 2023-07-14 MED ORDER — ONDANSETRON 4 MG PO TBDP: 4.0000 mg | ORAL_TABLET | Freq: Four times a day (QID) | ORAL | Status: DC | PRN

## 2023-07-14 MED ORDER — ACETAMINOPHEN 500 MG PO TABS
1000.0000 mg | ORAL_TABLET | Freq: Four times a day (QID) | ORAL | Status: DC
  Administered 2023-07-14 – 2023-07-15 (×6): 1000 mg via ORAL
  Filled 2023-07-14 (×6): qty 2

## 2023-07-14 MED ORDER — DOCUSATE SODIUM 100 MG PO CAPS
100.0000 mg | ORAL_CAPSULE | Freq: Two times a day (BID) | ORAL | Status: DC
  Administered 2023-07-14 – 2023-07-15 (×3): 100 mg via ORAL
  Filled 2023-07-14 (×3): qty 1

## 2023-07-14 MED ORDER — HYDRALAZINE HCL 20 MG/ML IJ SOLN: 10.0000 mg | INTRAMUSCULAR | Status: DC | PRN

## 2023-07-14 MED ORDER — ENOXAPARIN SODIUM 30 MG/0.3ML IJ SOSY: 30.0000 mg | PREFILLED_SYRINGE | Freq: Two times a day (BID) | INTRAMUSCULAR | Status: DC

## 2023-07-14 MED ORDER — OXYCODONE HCL 5 MG PO TABS
5.0000 mg | ORAL_TABLET | ORAL | Status: DC | PRN
  Administered 2023-07-14 – 2023-07-15 (×2): 5 mg via ORAL
  Filled 2023-07-14 (×2): qty 1

## 2023-07-14 MED ORDER — POLYETHYLENE GLYCOL 3350 17 G PO PACK: 17.0000 g | PACK | Freq: Every day | ORAL | Status: DC | PRN

## 2023-07-14 MED ORDER — OXYCODONE HCL 5 MG PO TABS
10.0000 mg | ORAL_TABLET | ORAL | Status: DC | PRN
  Administered 2023-07-14: 10 mg via ORAL
  Filled 2023-07-14: qty 2

## 2023-07-14 MED ORDER — HYDROMORPHONE HCL 1 MG/ML IJ SOLN
0.5000 mg | INTRAMUSCULAR | Status: DC | PRN
  Administered 2023-07-14: 0.5 mg via INTRAVENOUS
  Filled 2023-07-14: qty 0.5

## 2023-07-14 MED ORDER — METOPROLOL TARTRATE 5 MG/5ML IV SOLN: 5.0000 mg | Freq: Four times a day (QID) | INTRAVENOUS | Status: DC | PRN

## 2023-07-14 MED ORDER — METHOCARBAMOL 1000 MG/10ML IJ SOLN: 500.0000 mg | Freq: Three times a day (TID) | INTRAMUSCULAR | Status: DC

## 2023-07-14 MED ORDER — ONDANSETRON HCL 4 MG/2ML IJ SOLN: 4.0000 mg | Freq: Four times a day (QID) | INTRAMUSCULAR | Status: DC | PRN

## 2023-07-14 MED ORDER — METHOCARBAMOL 500 MG PO TABS
500.0000 mg | ORAL_TABLET | Freq: Three times a day (TID) | ORAL | Status: DC
  Administered 2023-07-14 – 2023-07-15 (×5): 500 mg via ORAL
  Filled 2023-07-14 (×5): qty 1
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
## 2023-07-14 NOTE — ED Notes (Signed)
 General surgeon here to see the pt

## 2023-07-14 NOTE — ED Notes (Signed)
 5n floor called  ready for the pt
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
## 2023-07-14 NOTE — Plan of Care (Signed)
  Problem: Education: Goal: Knowledge of General Education information will improve Description: Including pain rating scale, medication(s)/side effects and non-pharmacologic comfort measures Outcome: Progressing   Problem: Health Behavior/Discharge Planning: Goal: Ability to manage health-related needs will improve Outcome: Progressing   Problem: Clinical Measurements: Goal: Respiratory complications will improve Outcome: Progressing   Problem: Activity: Goal: Risk for activity intolerance will decrease Outcome: Progressing   Problem: Nutrition: Goal: Adequate nutrition will be maintained Outcome: Progressing   Problem: Coping: Goal: Level of anxiety will decrease Outcome: Progressing   Problem: Pain Managment: Goal: General experience of comfort will improve and/or be controlled Outcome: Progressing   Problem: Elimination: Goal: Will not experience complications related to bowel motility Outcome: Progressing Goal: Will not experience complications related to urinary retention Outcome: Progressing
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
## 2023-07-14 NOTE — ED Notes (Signed)
 Checked for the back brace the pt reports that she told them she could not stand so the brace is on the table at  the bedside

## 2023-07-15 MED ORDER — OXYCODONE HCL 5 MG PO TABS: 5.0000 mg | ORAL_TABLET | Freq: Four times a day (QID) | ORAL | 0 refills | Status: DC | PRN
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
## 2023-07-15 NOTE — Plan of Care (Signed)
  Problem: Activity: Goal: Risk for activity intolerance will decrease Outcome: Not Progressing   Problem: Pain Managment: Goal: General experience of comfort will improve and/or be controlled Outcome: Not Progressing   Problem: Safety: Goal: Ability to remain free from injury will improve Outcome: Not Progressing
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
## 2023-07-15 NOTE — TOC Transition Note (Signed)
 Transition of Care [facility]) - Discharge Note   Patient Details  Name: Alexa [name] MRN: [id number] Date of Birth: 06/25/1990  Transition of Care [facility]) CM/SW Contact:  Jannine Meo, RN Phone Number: 07/15/2023, 9:08 AM   Clinical Narrative:   Patient is being discharged today. Spoke with patient by phone, confirmed need for RW. Messaged for RW need through Jermaine with [employer] to be delivered to bedside before discharged home.    Final next level of care: Home/Self Care Barriers to Discharge: No Barriers Identified   Patient Goals and CMS Choice            Discharge Placement                       Discharge Plan and Services Additional resources added to the After Visit Summary for                  DME Arranged: Walker rolling DME Agency: [employer] Date DME Agency Contacted: 07/15/23 Time DME Agency Contacted: [phone] (Messaged) Representative spoke with at DME Agency: Zula Hitch            Social Drivers of Health (SDOH) Interventions SDOH Screenings   Tobacco Use: Medium Risk (07/14/2023)     Readmission Risk Interventions     No data to display

## 2023-07-15 NOTE — Care Management (Cosign Needed)
    Durable Medical Equipment  (From admission, onward)           Start     Ordered   07/15/23 0856  For home use only DME Walker rolling  Once       Question Answer Comment  Walker: With 5 Inch Wheels   Patient needs a walker to treat with the following condition Weakness      07/15/23 0855

## 2023-07-15 NOTE — Discharge Instructions (Signed)
 Follow up with PCP with any other issues Ok to shower and drive No restrictions
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
## 2023-07-27 ENCOUNTER — Ambulatory Visit: Admitting: Family Medicine

## 2023-07-27 VITALS — BP 116/74 | HR 71 | Ht 63.0 in | Wt 118.0 lb

## 2023-07-27 DIAGNOSIS — S32009A Unspecified fracture of unspecified lumbar vertebra, initial encounter for closed fracture: Secondary | ICD-10-CM

## 2023-07-27 DIAGNOSIS — F430 Acute stress reaction: Secondary | ICD-10-CM

## 2023-07-27 MED ORDER — OXYCODONE HCL 5 MG PO TABS: 5.0000 mg | ORAL_TABLET | Freq: Four times a day (QID) | ORAL | 0 refills | Status: DC | PRN

## 2023-07-27 MED ORDER — CYCLOBENZAPRINE HCL 10 MG PO TABS: 10.0000 mg | ORAL_TABLET | Freq: Two times a day (BID) | ORAL | 1 refills | Status: DC | PRN

## 2023-07-27 NOTE — Patient Instructions (Addendum)
 Thank you for coming in today.   EMDR therapy  Psychology today  Heating pad can help.   This will act like a broken rib.   Bed assist ladder.   Recheck in 2 weeks.   Kathrene Parents, AGNP-C,
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
## 2023-08-13 ENCOUNTER — Ambulatory Visit: Admitting: Family Medicine

## 2023-08-14 ENCOUNTER — Encounter: Admitting: Radiology
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
## 2023-08-17 ENCOUNTER — Ambulatory Visit (INDEPENDENT_AMBULATORY_CARE_PROVIDER_SITE_OTHER): Admitting: Family Medicine

## 2023-08-17 VITALS — BP 102/64 | HR 69 | Ht 63.0 in | Wt 119.0 lb

## 2023-08-17 DIAGNOSIS — S32009D Unspecified fracture of unspecified lumbar vertebra, subsequent encounter for fracture with routine healing: Secondary | ICD-10-CM

## 2023-08-17 DIAGNOSIS — F4310 Post-traumatic stress disorder, unspecified: Secondary | ICD-10-CM

## 2023-08-17 NOTE — Patient Instructions (Addendum)
 Thank you for coming in today.   OK to start exercising  Let me know if we should consider anxiety medications  Check back in 1 month

## 2023-09-21 ENCOUNTER — Encounter: Payer: Self-pay | Admitting: Family Medicine

## 2023-09-21 ENCOUNTER — Ambulatory Visit (INDEPENDENT_AMBULATORY_CARE_PROVIDER_SITE_OTHER): Admitting: Family Medicine

## 2023-09-21 VITALS — BP 102/62 | HR 70 | Ht 63.0 in | Wt 124.0 lb

## 2023-09-21 DIAGNOSIS — F4310 Post-traumatic stress disorder, unspecified: Secondary | ICD-10-CM | POA: Diagnosis not present

## 2023-09-21 DIAGNOSIS — S32009D Unspecified fracture of unspecified lumbar vertebra, subsequent encounter for fracture with routine healing: Secondary | ICD-10-CM | POA: Diagnosis not present
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
## 2023-09-21 NOTE — Patient Instructions (Signed)
 Thank you for coming in today.   Advance activity as tolerated. Ok to start doing some weight lifting starting with machines. Cleans should be the last thing to do.   I think therapy for PTSD is a great idea.   Recheck with me as needed.   We are here for you if you need anything.

## 2023-09-23 DIAGNOSIS — F4310 Post-traumatic stress disorder, unspecified: Secondary | ICD-10-CM | POA: Insufficient documentation

## 2023-10-19 ENCOUNTER — Encounter: Admitting: Radiology

## 2023-11-14 ENCOUNTER — Encounter: Payer: Self-pay | Admitting: Nurse Practitioner

## 2023-11-14 DIAGNOSIS — Z720 Tobacco use: Secondary | ICD-10-CM | POA: Insufficient documentation

## 2023-11-15 ENCOUNTER — Other Ambulatory Visit (HOSPITAL_COMMUNITY)
Admission: RE | Admit: 2023-11-15 | Discharge: 2023-11-15 | Disposition: A | Discharge status: Home / Self Care | Source: Ambulatory Visit | Attending: Nurse Practitioner | Admitting: Nurse Practitioner

## 2023-11-15 ENCOUNTER — Ambulatory Visit (INDEPENDENT_AMBULATORY_CARE_PROVIDER_SITE_OTHER): Admitting: Nurse Practitioner

## 2023-11-15 ENCOUNTER — Encounter: Payer: Self-pay | Admitting: Nurse Practitioner

## 2023-11-15 ENCOUNTER — Ambulatory Visit: Payer: Self-pay | Admitting: Nurse Practitioner

## 2023-11-15 VITALS — BP 104/66 | HR 88 | Temp 98.5°F | Ht 63.0 in | Wt 118.0 lb

## 2023-11-15 DIAGNOSIS — Z124 Encounter for screening for malignant neoplasm of cervix: Secondary | ICD-10-CM

## 2023-11-15 DIAGNOSIS — Z1159 Encounter for screening for other viral diseases: Secondary | ICD-10-CM | POA: Insufficient documentation

## 2023-11-15 DIAGNOSIS — Z13220 Encounter for screening for lipoid disorders: Secondary | ICD-10-CM | POA: Diagnosis not present

## 2023-11-15 DIAGNOSIS — Z0000 Encounter for general adult medical examination without abnormal findings: Secondary | ICD-10-CM | POA: Insufficient documentation

## 2023-11-15 DIAGNOSIS — Z1151 Encounter for screening for human papillomavirus (HPV): Secondary | ICD-10-CM | POA: Diagnosis not present

## 2023-11-15 DIAGNOSIS — Z01419 Encounter for gynecological examination (general) (routine) without abnormal findings: Secondary | ICD-10-CM | POA: Diagnosis not present

## 2023-11-15 DIAGNOSIS — Z113 Encounter for screening for infections with a predominantly sexual mode of transmission: Secondary | ICD-10-CM | POA: Diagnosis not present

## 2023-11-15 DIAGNOSIS — Z136 Encounter for screening for cardiovascular disorders: Secondary | ICD-10-CM | POA: Diagnosis not present

## 2023-11-15 LAB — LIPID PANEL
Cholesterol: 135 mg/dL (ref 0–200)
HDL: 81.3 mg/dL (ref >39.00)
LDL Cholesterol: 44 mg/dL (ref 0–99)
NonHDL: 53.68
Total CHOL/HDL Ratio: 2
Triglycerides: 47 mg/dL (ref 0.0–149.0)
VLDL: 9.4 mg/dL (ref 0.0–40.0)

## 2023-11-15 LAB — TSH: TSH: 1.39 u[IU]/mL (ref 0.35–5.50)

## 2023-11-15 LAB — HEPATIC FUNCTION PANEL
ALT: 10 U/L (ref 0–35)
AST: 15 U/L (ref 0–37)
Albumin: 4 g/dL (ref 3.5–5.2)
Alkaline Phosphatase: 29 U/L — ABNORMAL LOW (ref 39–117)
Bilirubin, Direct: 0.2 mg/dL (ref 0.0–0.3)
Total Bilirubin: 0.7 mg/dL (ref 0.2–1.2)
Total Protein: 6.7 g/dL (ref 6.0–8.3)

## 2023-11-15 NOTE — Assessment & Plan Note (Signed)
 Resumed tobacco use and vaping 2023. Smokes 1/2ppd and vapes daily. Advised about need to quit
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
## 2023-11-15 NOTE — Patient Instructions (Signed)
 Thank you for choosing [facility]  Go to lab for blood draw  Preventive Care 38-32 Years Old, Female Preventive care refers to lifestyle choices and visits with your health care provider that can promote health and wellness. Preventive care visits are also called wellness exams. What can I expect for my preventive care visit? Counseling During your preventive care visit, your health care provider may ask about your: Medical history, including: Past medical problems. Family medical history. Pregnancy history. Current health, including: Menstrual cycle. Method of birth control. Emotional well-being. Home life and relationship well-being. Sexual activity and sexual health. Lifestyle, including: Alcohol, nicotine or tobacco, and drug use. Access to firearms. Diet, exercise, and sleep habits. Work and work Astronomer. Sunscreen use. Safety issues such as seatbelt and bike helmet use. Physical exam Your health care provider may check your: Height and weight. These may be used to calculate your BMI (body mass index). BMI is a measurement that tells if you are at a healthy weight. Waist circumference. This measures the distance around your waistline. This measurement also tells if you are at a healthy weight and may help predict your risk of certain diseases, such as type 2 diabetes and high blood pressure. Heart rate and blood pressure. Body temperature. Skin for abnormal spots. What immunizations do I need?  Vaccines are usually given at various ages, according to a schedule. Your health care provider will recommend vaccines for you based on your age, medical history, and lifestyle or other factors, such as travel or where you work. What tests do I need? Screening Your health care provider may recommend screening tests for certain conditions. This may include: Pelvic exam and Pap test. Lipid and cholesterol levels. Diabetes screening. This is done by checking your blood  sugar (glucose) after you have not eaten for a while (fasting). Hepatitis B test. Hepatitis C test. HIV (human immunodeficiency virus) test. STI (sexually transmitted infection) testing, if you are at risk. BRCA-related cancer screening. This may be done if you have a family history of breast, ovarian, tubal, or peritoneal cancers. Talk with your health care provider about your test results, treatment options, and if necessary, the need for more tests. Follow these instructions at home: Eating and drinking  Eat a healthy diet that includes fresh fruits and vegetables, whole grains, lean protein, and low-fat dairy products. Take vitamin and mineral supplements as recommended by your health care provider. Do not drink alcohol if: Your health care provider tells you not to drink. You are pregnant, may be pregnant, or are planning to become pregnant. If you drink alcohol: Limit how much you have to 0-1 drink a day. Know how much alcohol is in your drink. In the [place] one drink equals one 12 oz bottle of beer (355 mL), one 5 oz glass of wine (148 mL), or one 1 oz glass of hard liquor (44 mL). Lifestyle Brush your teeth every morning and night with fluoride toothpaste. Floss one time each day. Exercise for at least 30 minutes 5 or more days each week. Do not use any products that contain nicotine or tobacco. These products include cigarettes, chewing tobacco, and vaping devices, such as e-cigarettes. If you need help quitting, ask your health care provider. Do not use drugs. If you are sexually active, practice safe sex. Use a condom or other form of protection to prevent STIs. If you do not wish to become pregnant, use a form of birth control. If you plan to become pregnant, see your health  care provider for a prepregnancy visit. Find healthy ways to manage stress, such as: Meditation, yoga, or listening to music. Journaling. Talking to a trusted person. Spending time with friends and  family. Minimize exposure to UV radiation to reduce your risk of skin cancer. Safety Always wear your seat belt while driving or riding in a vehicle. Do not drive: If you have been drinking alcohol. Do not ride with someone who has been drinking. If you have been using any mind-altering substances or drugs. While texting. When you are tired or distracted. Wear a helmet and other protective equipment during sports activities. If you have firearms in your house, make sure you follow all gun safety procedures. Seek help if you have been physically or sexually abused. What's next? Go to your health care provider once a year for an annual wellness visit. Ask your health care provider how often you should have your eyes and teeth checked. Stay up to date on all vaccines. This information is not intended to replace advice given to you by your health care provider. Make sure you discuss any questions you have with your health care provider. Document Revised: 09/15/2020 Document Reviewed: 09/15/2020 Elsevier Patient Education  2024 [employer].

## 2023-11-16 LAB — CERVICOVAGINAL ANCILLARY ONLY
Chlamydia: NEGATIVE
Comment: NEGATIVE
Comment: NEGATIVE
Comment: NORMAL
Neisseria Gonorrhea: NEGATIVE
Trichomonas: NEGATIVE

## 2023-11-16 LAB — RPR: RPR Ser Ql: NONREACTIVE

## 2023-11-16 LAB — HEPATITIS C ANTIBODY: Hepatitis C Ab: NONREACTIVE

## 2023-11-18 LAB — CYTOLOGY - PAP
Comment: NEGATIVE
Diagnosis: NEGATIVE
Diagnosis: REACTIVE
High risk HPV: NEGATIVE

## 2023-12-13 ENCOUNTER — Other Ambulatory Visit: Payer: Self-pay

## 2023-12-13 ENCOUNTER — Ambulatory Visit
Admission: RE | Admit: 2023-12-13 | Discharge: 2023-12-13 | Disposition: A | Discharge status: Home / Self Care | Source: Ambulatory Visit | Attending: Emergency Medicine | Admitting: Emergency Medicine

## 2023-12-13 VITALS — BP 107/72 | HR 76 | Temp 98.2°F | Resp 16

## 2023-12-13 DIAGNOSIS — R52 Pain, unspecified: Secondary | ICD-10-CM | POA: Diagnosis not present

## 2023-12-13 DIAGNOSIS — J069 Acute upper respiratory infection, unspecified: Secondary | ICD-10-CM | POA: Diagnosis not present

## 2023-12-13 LAB — POC COVID19/FLU A&B COMBO
Covid Antigen, POC: NEGATIVE
Influenza A Antigen, POC: NEGATIVE
Influenza B Antigen, POC: NEGATIVE

## 2023-12-13 MED ORDER — CYCLOBENZAPRINE HCL 10 MG PO TABS: 10.0000 mg | ORAL_TABLET | Freq: Every evening | ORAL | 0 refills | Status: DC

## 2023-12-13 NOTE — Discharge Instructions (Addendum)
 Your COVID and flu test today were negative You likely have a viral respiratory illness.  This is treated with supportive care. I recommend plain guaifenesin (Mucinex) for congestion. Drink lots of water and fluids.   You can take the muscle relaxer Flexeril  twice daily. If the medication makes you drowsy, take only at bed time.  Allow 4-5 more days for improvement

## 2023-12-13 NOTE — ED Provider Notes (Signed)
 [facility]    CSN: [id number] Arrival date & time: 12/13/23  9070      History   Chief Complaint Chief Complaint  Patient presents with   Generalized Body Aches    I would like a Covid and flu test - Entered by patient    HPI Alexa [name] is a 33 y.o. female.  3-day history of chills, body aches, nasal congestion and slight cough No measured fever No abd pain, NVD She is requesting COVID and flu test today Reports kids have been sick - attend school  Has tried dayquil   Past Medical History:  Diagnosis Date   Headache(784.0)    migraines, 2-3 times a month   Medical history non-contributory    SVD (spontaneous vaginal delivery) 10/28/2018   Term pregnancy 10/28/2018    Patient Active Problem List   Diagnosis Date Noted   Tobacco user 11/14/2023   PTSD (post-traumatic stress disorder) 09/23/2023   Lumbar transverse process fracture (HCC) 07/14/2023   Migraine 05/25/2012   Family history of Down syndrome 10/25/2010    Past Surgical History:  Procedure Laterality Date   DILATION AND EVACUATION N/A 09/21/2017   Procedure: DILATATION AND EVACUATION;  Surgeon: Delana Ted Morrison, DO;  Location: [facility];  Service: Gynecology;  Laterality: N/A;   fractured arm     anesthesia to set and cast, left arm, 33 years old   MASS EXCISION     6 yeares old, neck   NO PAST SURGERIES      OB History     Gravida  4   Para  2   Term  2   Preterm  0   AB  1   Living  2      SAB  0   IAB  1   Ectopic  0   Multiple  0   Live Births  2            Home Medications    Prior to Admission medications   Medication Sig Start Date End Date Taking? Authorizing Provider  cyclobenzaprine  (FLEXERIL ) 10 MG tablet Take 1 tablet (10 mg total) by mouth at bedtime. 12/13/23  Yes Quantia Grullon, [place], PA-C    Family History Family History  Problem Relation Age of Onset   Hypertension Father    Cancer Paternal Aunt 53       breast cancer    Heart disease Maternal Grandmother    Hypertension Maternal Grandfather    Anesthesia problems Neg Hx     Social History Social History   Tobacco Use   Smoking status: Every Day    Current packs/day: 0.50    Average packs/day: 0.5 packs/day for 2.7 years (1.3 ttl pk-yrs)    Types: Cigarettes    Start date: 04/04/2021   Smokeless tobacco: Never   Tobacco comments:    quit about 4-5 weeks ago   Vaping Use   Vaping status: Every Day   Substances: Nicotine, CBD  Substance Use Topics   Alcohol use: Yes    Alcohol/week: 4.0 standard drinks of alcohol    Types: 4 Glasses of wine per week   Drug use: Yes    Frequency: 1.0 times per week    Types: Marijuana     Allergies   Patient has no known allergies.   Review of Systems Review of Systems   Physical Exam Triage Vital Signs ED Triage Vitals  Encounter Vitals Group     BP 12/13/23 1005 107/72  Girls Systolic BP Percentile --      Girls Diastolic BP Percentile --      Boys Systolic BP Percentile --      Boys Diastolic BP Percentile --      Pulse Rate 12/13/23 1005 76     Resp 12/13/23 1005 16     Temp 12/13/23 1005 98.2 F (36.8 C)     Temp Source 12/13/23 1005 Oral     SpO2 12/13/23 1005 96 %     Weight --      Height --      Head Circumference --      Peak Flow --      Pain Score 12/13/23 1003 5     Pain Loc --      Pain Education --      Exclude from Growth Chart --    No data found.  Updated Vital Signs BP 107/72   Pulse 76   Temp 98.2 F (36.8 C) (Oral)   Resp 16   LMP 12/06/2023 (Exact Date)   SpO2 96%   Visual Acuity Right Eye Distance:   Left Eye Distance:   Bilateral Distance:    Right Eye Near:   Left Eye Near:    Bilateral Near:     Physical Exam Vitals and nursing note reviewed.  Constitutional:      General: She is not in acute distress. HENT:     Right Ear: Tympanic membrane and ear canal normal.     Left Ear: Tympanic membrane and ear canal normal.     Nose: Congestion  present. No rhinorrhea.     Mouth/Throat:     Mouth: Mucous membranes are moist.     Pharynx: Oropharynx is clear. No posterior oropharyngeal erythema.  Eyes:     Conjunctiva/sclera: Conjunctivae normal.  Cardiovascular:     Rate and Rhythm: Normal rate and regular rhythm.     Pulses: Normal pulses.     Heart sounds: Normal heart sounds.  Pulmonary:     Effort: Pulmonary effort is normal.     Breath sounds: Normal breath sounds. No wheezing.  Musculoskeletal:     Cervical back: Normal range of motion. No rigidity.  Lymphadenopathy:     Cervical: No cervical adenopathy.  Skin:    General: Skin is warm and dry.  Neurological:     Mental Status: She is alert and oriented to person, place, and time.      UC Treatments / Results  Labs (all labs ordered are listed, but only abnormal results are displayed) Labs Reviewed  POC COVID19/FLU A&B COMBO    EKG   Radiology No results found.  Procedures Procedures (including critical care time)  Medications Ordered in UC Medications - No data to display  Initial Impression / Assessment and Plan / UC Course  I have reviewed the triage vital signs and the nursing notes.  Pertinent labs & imaging results that were available during my care of the patient were reviewed by me and considered in my medical decision making (see chart for details).  Afebrile, clear lungs Rapid flu and covid negative Advised viral illness, supportive care Declines cough medicine Trial of muscle relaxer for body aches Other OTC options  Return precautions. Note for work provided No questions   Final Clinical Impressions(s) / UC Diagnoses   Final diagnoses:  Body aches  Viral URI     Discharge Instructions      Your COVID and flu test today were negative You likely  have a viral respiratory illness.  This is treated with supportive care. I recommend plain guaifenesin (Mucinex) for congestion. Drink lots of water and fluids.   You can take  the muscle relaxer Flexeril  twice daily. If the medication makes you drowsy, take only at bed time.  Allow 4-5 more days for improvement      ED Prescriptions     Medication Sig Dispense Auth. Provider   cyclobenzaprine  (FLEXERIL ) 10 MG tablet Take 1 tablet (10 mg total) by mouth at bedtime. 10 tablet Aldyn Toon, [place], PA-C      PDMP not reviewed this encounter.   Orean Giarratano, PA-C 12/13/23 1115

## 2023-12-13 NOTE — ED Triage Notes (Signed)
 Pt c/o nasal congestion, bodyaches, dry cough, chillsx3d.

## 2024-01-07 ENCOUNTER — Ambulatory Visit: Payer: Self-pay | Admitting: *Deleted
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
## 2024-01-15 ENCOUNTER — Ambulatory Visit (INDEPENDENT_AMBULATORY_CARE_PROVIDER_SITE_OTHER): Admitting: Nurse Practitioner

## 2024-01-15 VITALS — BP 110/72 | HR 78 | Temp 98.6°F | Ht 63.0 in | Wt 120.6 lb

## 2024-01-15 DIAGNOSIS — F4310 Post-traumatic stress disorder, unspecified: Secondary | ICD-10-CM | POA: Diagnosis not present

## 2024-01-15 MED ORDER — BUSPIRONE HCL 5 MG PO TABS: 5.0000 mg | ORAL_TABLET | Freq: Two times a day (BID) | ORAL | 2 refills | Status: AC
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
## 2024-01-15 NOTE — Patient Instructions (Signed)
 Maintain appts with therapist Start buspar as discussed  Managing Post-Traumatic Stress Disorder If you have been diagnosed with post-traumatic stress disorder (PTSD), you may be relieved that you now know why you have felt or behaved a certain way. Still, you may feel overwhelmed and concerned for your future.  With the right treatment and support, you can live your life with fewer symptoms. What actions can I take to manage PTSD? Manage stress Stress is your body's reaction to life changes and events, both good and bad. Stress can make PTSD worse. Take the following steps to manage stress: Talk with your health care provider or a counselor about ways to lower your stress. These may include: Physical exercise. Meditation, yoga, or other mind-body exercises. Exercises to relax your muscles. Breathing exercises. Listening to quiet music. Spending time outside. Eat a healthy diet. Get plenty of sleep. Take time to relax. Spend time with others. Talk with them about how you are feeling and what you need. Do activities that you enjoy. Have a schedule and take regular breaks. Take your medicines Symptoms of PTSD can make you feel depressed and anxious. They can also disrupt reality. Medicines can help lessen symptoms and make you feel better. It is important to: Talk with your pharmacist or health care provider about all medicines you take. Talk about the side effects. Discuss which medicines are safe to take together. Be involved in your care. Decide on the best treatment plan with your health care provider. Be aware that if you decide to take medicine, it can take 4-8 weeks before you notice a difference in your symptoms. Talk to your health care provider before stopping medicines. Stay close to family and friends PTSD can affect relationships. Make an effort to: Trust your friends and loved ones. Trusting others can help you feel safe and connect you with emotional support. Be open and  honest about your feelings. Have fun and relax in safe spaces, such as with friends and family. Think about going to couples counseling, family education classes, or family therapy. Therapy can be helpful for everyone. Talk with friends and family about how they can best support you. What are signs that my condition is getting worse? Be aware of your symptoms and how often you have them. The following symptoms mean that you may need additional help: You feel suspicious and angry. You have repeated flashbacks. You avoid going out or being with others. You have more fights with close friends or family members. You have thoughts about hurting yourself or others. You cannot get relief from feelings of depression or anxiety. Follow these instructions at home: Lifestyle Exercise at least 30 minutes a few times a week. Try to get 7-9 hours of sleep each night. To help with sleep: Keep your bedroom cool and dark. Do not eat a heavy meal within 1 hour of bedtime. Avoid caffeine  for at least 8 hours before bed. Avoid screen time before bed. This includes television, computers, tablets, and mobile phones. Try to have fun and find humor in your life. Eat a healthy diet. Write your thoughts and feelings down in a journal or on a tablet or mobile phone. General instructions Take over-the-counter and prescription medicines only as told by your health care provider. Do not useillegal drugs. Know and remember that healing from trauma takes time. Keep your health care team up to date about your PTSD symptoms and treatment. Alcohol use Do not drink alcohol if: Your health care provider tells you not to  drink. You are pregnant, may be pregnant, or are planning to become pregnant. If you drink alcohol: Limit how much you have to: 0-1 drink a day for women. 0-2 drinks a day for men. Know how much alcohol is in your drink. In the [place] one drink equals one 12 oz bottle of beer (355 mL), one 5 oz glass  of wine (148 mL), or one 1 oz glass of hard liquor (44 mL). Where to find more information For more information about PTSD, treatment, and how to get support, go to: [employer]: BloggerCourse.com National Center for PTSD: ptsd.FitBoxer.tn Contact a health care provider if: Your symptoms get worse or do not get better. You have trouble doing daily tasks. You have loss of appetite. You have trouble sleeping. Get help right away if: You have thoughts about hurting yourself or others. Get help right away if you feel like you may hurt yourself or others, or have thoughts about taking your own life. Go to your nearest emergency room or: Call 911. Call the National Suicide Prevention Lifeline at 548-812-1870 or 988. This is open 24 hours a day. Text the Crisis Text Line at 309-784-8269. Summary With the right treatment and support, you can live your life with fewer symptoms. Find supportive people and settings. Spend time in those places. Keep in contact with those people. Work with your health care team to create a plan to manage the symptoms of PTSD. The plan should include counseling, good habits, and techniques to reduce stress. This information is not intended to replace advice given to you by your health care provider. Make sure you discuss any questions you have with your health care provider. Document Revised: 07/08/2021 Document Reviewed: 07/08/2021 Elsevier Patient Education  2024 [employer].
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
## 2024-01-31 ENCOUNTER — Ambulatory Visit (INDEPENDENT_AMBULATORY_CARE_PROVIDER_SITE_OTHER): Admitting: Nurse Practitioner

## 2024-01-31 ENCOUNTER — Encounter: Payer: Self-pay | Admitting: Nurse Practitioner

## 2024-01-31 VITALS — BP 108/66 | HR 70 | Temp 98.6°F | Ht 63.0 in | Wt 118.4 lb

## 2024-01-31 DIAGNOSIS — F4310 Post-traumatic stress disorder, unspecified: Secondary | ICD-10-CM | POA: Diagnosis not present

## 2024-01-31 NOTE — Assessment & Plan Note (Signed)
 No change in mood with buspar 5mg  BID, no adverse effects with med. Has continued sessions with her therapist.  Advised to Increase buspar dose to 1tab 3x/day. If no improvement in 1week, increase dose to 2tabs in AM and 2tabs in PM. If no improvement in 1week, send message via mychart. Consider use of an SSRI or SNRI. F/up in 74month

## 2024-01-31 NOTE — Patient Instructions (Signed)
 Increase buspar dose to 1tab 3x/day. If no improvement in 1week, increase dose to 2tabs in AM and 2tabs in PM. If no improvement in 1week, send message via mychart.

## 2024-01-31 NOTE — Progress Notes (Signed)
                Established Patient Visit  Patient: Alexa [name]   DOB: 04/10/1990   33 y.o. Female  MRN: [id number] Visit Date: 01/31/2024  Subjective:    Chief Complaint  Patient presents with   Follow-up    Follow up for anxiety    HPI PTSD (post-traumatic stress disorder) No change in mood with buspar 5mg  BID, no adverse effects with med. Has continued sessions with her therapist.  Advised to Increase buspar dose to 1tab 3x/day. If no improvement in 1week, increase dose to 2tabs in AM and 2tabs in PM. If no improvement in 1week, send message via mychart. Consider use of an SSRI or SNRI. F/up in 60month  Reviewed medical, surgical, and social history today  Medications: Outpatient Medications Prior to Visit  Medication Sig   busPIRone (BUSPAR) 5 MG tablet Take 1 tablet (5 mg total) by mouth 2 (two) times daily. Take additional dose at noon if needed   No facility-administered medications prior to visit.   Reviewed past medical and social history.   ROS per HPI above      Objective:  BP 108/66 (BP Location: Left Arm, Patient Position: Sitting, Cuff Size: Large)   Pulse 70   Temp 98.6 F (37 C) (Temporal)   Ht 5' 3 (1.6 m)   Wt 118 lb 6.4 oz (53.7 kg)   LMP 01/08/2024 (Exact Date)   SpO2 99%   BMI 20.97 kg/m      Physical Exam Vitals and nursing note reviewed.  Neurological:     Mental Status: She is oriented to person, place, and time.  Psychiatric:        Attention and Perception: Attention normal.        Mood and Affect: Mood normal.        Speech: Speech normal.        Behavior: Behavior normal.        Thought Content: Thought content normal.        Cognition and Memory: Cognition and memory normal.        Judgment: Judgment normal.     No results found for any visits on 01/31/24.    Assessment & Plan:    Problem List Items Addressed This Visit     PTSD (post-traumatic stress disorder) - Primary   No change in mood with buspar  5mg  BID, no adverse effects with med. Has continued sessions with her therapist.  Advised to Increase buspar dose to 1tab 3x/day. If no improvement in 1week, increase dose to 2tabs in AM and 2tabs in PM. If no improvement in 1week, send message via mychart. Consider use of an SSRI or SNRI. F/up in 60month      Return in about 4 weeks (around 02/28/2024) for depression and anxiety (video).     Roselie Mood, NP

## 2024-11-21 ENCOUNTER — Encounter: Admitting: Nurse Practitioner
